# Patient Record
Sex: Male | Born: 1985 | Race: White | Hispanic: No | Marital: Married
Health system: Southern US, Community
[De-identification: ages and names within clinical notes are randomized; demographics above are authoritative.]

## PROBLEM LIST (undated history)

## (undated) HISTORY — PX: TYMPANOPLASTY: SHX33

## (undated) HISTORY — PX: TONSILLECTOMY: SUR1361

---
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
## 2007-04-14 ENCOUNTER — Emergency Department (HOSPITAL_COMMUNITY): Admission: EM | Admit: 2007-04-14 | Discharge: 2007-04-14 | Payer: Self-pay | Admitting: Emergency Medicine

## 2007-12-07 ENCOUNTER — Emergency Department (HOSPITAL_COMMUNITY): Admission: EM | Admit: 2007-12-07 | Discharge: 2007-12-07 | Payer: Self-pay | Admitting: Emergency Medicine

## 2008-10-12 ENCOUNTER — Emergency Department (HOSPITAL_COMMUNITY): Admission: EM | Admit: 2008-10-12 | Discharge: 2008-10-12 | Payer: Self-pay | Admitting: Emergency Medicine

## 2011-08-12 ENCOUNTER — Other Ambulatory Visit: Payer: Self-pay | Admitting: Internal Medicine

## 2011-08-12 DIAGNOSIS — N442 Benign cyst of testis: Secondary | ICD-10-CM

## 2011-08-13 ENCOUNTER — Other Ambulatory Visit: Payer: Self-pay | Admitting: Internal Medicine

## 2011-08-13 ENCOUNTER — Ambulatory Visit
Admission: RE | Admit: 2011-08-13 | Discharge: 2011-08-13 | Disposition: A | Discharge status: Home / Self Care | Payer: 59 | Source: Ambulatory Visit | Attending: Internal Medicine | Admitting: Internal Medicine

## 2011-08-13 DIAGNOSIS — N442 Benign cyst of testis: Secondary | ICD-10-CM

## 2017-04-09 ENCOUNTER — Ambulatory Visit
Admission: RE | Admit: 2017-04-09 | Discharge: 2017-04-09 | Disposition: A | Discharge status: Home / Self Care | Payer: 59 | Source: Ambulatory Visit | Attending: Internal Medicine | Admitting: Internal Medicine

## 2017-04-09 ENCOUNTER — Other Ambulatory Visit: Payer: Self-pay | Admitting: Internal Medicine

## 2017-04-09 ENCOUNTER — Observation Stay (HOSPITAL_COMMUNITY)
Admission: EM | Admit: 2017-04-09 | Discharge: 2017-04-11 | Disposition: A | Discharge status: Home / Self Care | Payer: 59 | Attending: Surgery | Admitting: Surgery

## 2017-04-09 ENCOUNTER — Encounter (HOSPITAL_COMMUNITY): Payer: Self-pay | Admitting: Emergency Medicine

## 2017-04-09 DIAGNOSIS — J302 Other seasonal allergic rhinitis: Secondary | ICD-10-CM

## 2017-04-09 DIAGNOSIS — R109 Unspecified abdominal pain: Secondary | ICD-10-CM | POA: Diagnosis not present

## 2017-04-09 DIAGNOSIS — K3533 Acute appendicitis with perforation and localized peritonitis, with abscess: Secondary | ICD-10-CM | POA: Diagnosis present

## 2017-04-09 DIAGNOSIS — K3580 Unspecified acute appendicitis: Secondary | ICD-10-CM

## 2017-04-09 DIAGNOSIS — K353 Acute appendicitis with localized peritonitis: Principal | ICD-10-CM | POA: Insufficient documentation

## 2017-04-09 LAB — COMPREHENSIVE METABOLIC PANEL
ALK PHOS: 69 U/L (ref 38–126)
ALT: 27 U/L (ref 17–63)
ANION GAP: 6 (ref 5–15)
AST: 20 U/L (ref 15–41)
Albumin: 3.6 g/dL (ref 3.5–5.0)
BUN: 13 mg/dL (ref 6–20)
CHLORIDE: 104 mmol/L (ref 101–111)
CO2: 27 mmol/L (ref 22–32)
Calcium: 9.1 mg/dL (ref 8.9–10.3)
Creatinine, Ser: 1.19 mg/dL (ref 0.61–1.24)
GFR calc Af Amer: >60 mL/min (ref >60)
GFR calc non Af Amer: >60 mL/min (ref >60)
Glucose, Bld: 91 mg/dL (ref 65–99)
Potassium: 4.1 mmol/L (ref 3.5–5.1)
Sodium: 137 mmol/L (ref 135–145)
Total Bilirubin: 1.1 mg/dL (ref 0.3–1.2)
Total Protein: 6.3 g/dL — ABNORMAL LOW (ref 6.5–8.1)

## 2017-04-09 LAB — CBC
HCT: 41.3 % (ref 39.0–52.0)
HEMOGLOBIN: 14.3 g/dL (ref 13.0–17.0)
MCH: 29.3 pg (ref 26.0–34.0)
MCHC: 34.6 g/dL (ref 30.0–36.0)
MCV: 84.6 fL (ref 78.0–100.0)
Platelets: 336 10*3/uL (ref 150–400)
RBC: 4.88 MIL/uL (ref 4.22–5.81)
RDW: 13.1 % (ref 11.5–15.5)
WBC: 11.1 10*3/uL — ABNORMAL HIGH (ref 4.0–10.5)

## 2017-04-09 LAB — LIPASE, BLOOD: Lipase: 24 U/L (ref 11–51)

## 2017-04-09 LAB — SURGICAL PCR SCREEN
MRSA, PCR: NEGATIVE
Staphylococcus aureus: POSITIVE — AB

## 2017-04-09 MED ORDER — ACETAMINOPHEN 325 MG PO TABS: 650.0000 mg | ORAL_TABLET | Freq: Four times a day (QID) | ORAL | Status: AC | PRN

## 2017-04-09 MED ORDER — METRONIDAZOLE IN NACL 5-0.79 MG/ML-% IV SOLN
500.0000 mg | Freq: Three times a day (TID) | INTRAVENOUS | Status: DC
  Administered 2017-04-09 – 2017-04-11 (×5): 500 mg via INTRAVENOUS
  Filled 2017-04-09 (×7): qty 100

## 2017-04-09 MED ORDER — IBUPROFEN 600 MG PO TABS: 600.0000 mg | ORAL_TABLET | Freq: Four times a day (QID) | ORAL | 0 refills | Status: AC | PRN

## 2017-04-09 MED ORDER — ACETAMINOPHEN 325 MG PO TABS: 650.0000 mg | ORAL_TABLET | Freq: Four times a day (QID) | ORAL | Status: DC | PRN

## 2017-04-09 MED ORDER — DIPHENHYDRAMINE HCL 50 MG/ML IJ SOLN: 25.0000 mg | Freq: Four times a day (QID) | INTRAMUSCULAR | Status: DC | PRN

## 2017-04-09 MED ORDER — OXYCODONE HCL 5 MG PO TABS
5.0000 mg | ORAL_TABLET | ORAL | Status: DC | PRN
  Administered 2017-04-10: 10 mg via ORAL
  Filled 2017-04-09 (×2): qty 2

## 2017-04-09 MED ORDER — DIPHENHYDRAMINE HCL 25 MG PO CAPS: 25.0000 mg | ORAL_CAPSULE | Freq: Four times a day (QID) | ORAL | Status: DC | PRN

## 2017-04-09 MED ORDER — DEXTROSE 5 % IV SOLN
2.0000 g | INTRAVENOUS | Status: DC
  Administered 2017-04-09 – 2017-04-10 (×2): 2 g via INTRAVENOUS
  Filled 2017-04-09 (×2): qty 2

## 2017-04-09 MED ORDER — METRONIDAZOLE IN NACL 5-0.79 MG/ML-% IV SOLN
500.0000 mg | Freq: Once | INTRAVENOUS | Status: DC
  Administered 2017-04-09: 500 mg via INTRAVENOUS
  Filled 2017-04-09: qty 100

## 2017-04-09 MED ORDER — ACETAMINOPHEN 650 MG RE SUPP: 650.0000 mg | Freq: Four times a day (QID) | RECTAL | Status: DC | PRN

## 2017-04-09 MED ORDER — ONDANSETRON 4 MG PO TBDP: 4.0000 mg | ORAL_TABLET | Freq: Four times a day (QID) | ORAL | Status: DC | PRN

## 2017-04-09 MED ORDER — KCL IN DEXTROSE-NACL 20-5-0.45 MEQ/L-%-% IV SOLN
INTRAVENOUS | Status: DC
  Administered 2017-04-09: 16:00:00 via INTRAVENOUS
  Filled 2017-04-09 (×2): qty 1000

## 2017-04-09 MED ORDER — ONDANSETRON HCL 4 MG/2ML IJ SOLN
4.0000 mg | Freq: Four times a day (QID) | INTRAMUSCULAR | Status: DC | PRN
Start: 2017-04-09 — End: 2017-04-11
  Administered 2017-04-10: 4 mg via INTRAVENOUS
  Filled 2017-04-09: qty 2

## 2017-04-09 MED ORDER — CEFTRIAXONE SODIUM 2 G IJ SOLR
2.0000 g | Freq: Once | INTRAMUSCULAR | Status: DC
  Filled 2017-04-09: qty 2

## 2017-04-09 MED ORDER — IBUPROFEN 600 MG PO TABS
600.0000 mg | ORAL_TABLET | Freq: Four times a day (QID) | ORAL | Status: DC | PRN
  Administered 2017-04-10 – 2017-04-11 (×3): 600 mg via ORAL
  Filled 2017-04-09 (×3): qty 1

## 2017-04-09 MED ORDER — OXYCODONE HCL 5 MG PO TABS: 5.0000 mg | ORAL_TABLET | Freq: Four times a day (QID) | ORAL | 0 refills | Status: AC | PRN

## 2017-04-09 MED ORDER — OXYCODONE HCL 5 MG PO TABS: 5.0000 mg | ORAL_TABLET | Freq: Four times a day (QID) | ORAL | 0 refills | Status: DC | PRN

## 2017-04-09 MED ORDER — IOPAMIDOL (ISOVUE-300) INJECTION 61%
125.0000 mL | Freq: Once | INTRAVENOUS | Status: AC | PRN
  Administered 2017-04-09: 125 mL via INTRAVENOUS

## 2017-04-09 NOTE — Anesthesia Preprocedure Evaluation (Addendum)
Anesthesia Evaluation  Patient identified by MRN, date of birth, ID band Patient awake    Reviewed: Allergy & Precautions, NPO status , Patient's Chart, lab work & pertinent test results  Airway Mallampati: II  TM Distance: >3 FB Neck ROM: Full    Dental  (+) Dental Advisory Given, Teeth Intact   Pulmonary neg pulmonary ROS,    breath sounds clear to auscultation       Cardiovascular negative cardio ROS   Rhythm:Regular Rate:Normal     Neuro/Psych    GI/Hepatic Neg liver ROS, History noted.    Endo/Other  negative endocrine ROS  Renal/GU negative Renal ROS     Musculoskeletal   Abdominal   Peds  Hematology   Anesthesia Other Findings   Reproductive/Obstetrics                           Anesthesia Physical Anesthesia Plan  ASA: I  Anesthesia Plan: General   Post-op Pain Management:    Induction: Intravenous, Rapid sequence and Cricoid pressure planned  Airway Management Planned: Oral ETT  Additional Equipment:   Intra-op Plan:   Post-operative Plan: Extubation in OR  Informed Consent: I have reviewed the patients History and Physical, chart, labs and discussed the procedure including the risks, benefits and alternatives for the proposed anesthesia with the patient or authorized representative who has indicated his/her understanding and acceptance.   Dental advisory given  Plan Discussed with: Anesthesiologist and CRNA  Anesthesia Plan Comments:        Anesthesia Quick Evaluation

## 2017-04-09 NOTE — ED Provider Notes (Signed)
[facility]-EMERGENCY DEPT Provider Note   CSN: [id number] Arrival date & time: 04/09/17  1153     History   Chief Complaint Chief Complaint  Patient presents with  . Abdominal Pain    dx with appendicitis at pcp    HPI Nathan Shields is a 31 y.o. male.  HPI   31 year old male presents today with complaints of appendicitis.  Patient reports that he was seen by his primary care after 2 days of right lower quadrant abdominal pain.  He notes CT scan was done today which showed acute appendicitis.  Patient reports last week he had what appeared to be a stomach virus as his coworker had the same thing.  Resolved without complication.  2 days ago right lower quadrant pain worse with palpation.  He denies any associated nausea vomiting fever diarrhea or bloody bowel movements.  No history of surgeries in the past.  Otherwise healthy.  Patient reports he ate breakfast around 930 this morning, and has been drinking water since.  Patient reports surgical history including tonsillectomy no complications with anesthesia.  History reviewed. No pertinent past medical history.  There are no active problems to display for this patient.   Past Surgical History:  Procedure Laterality Date  . TONSILLECTOMY    . TYMPANOPLASTY         Home Medications    Prior to Admission medications   Not on File    Family History No family history on file.  Social History Social History  Substance Use Topics  . Smoking status: Not on file  . Smokeless tobacco: Not on file  . Alcohol use No     Allergies   Patient has no known allergies.   Review of Systems Review of Systems  All other systems reviewed and are negative.    Physical Exam Updated Vital Signs BP 121/74 (BP Location: Right Arm)   Pulse 78   Temp 98.3 F (36.8 C) (Oral)   Resp 16   SpO2 100%   Physical Exam  Constitutional: He is oriented to person, place, and time. He appears well-developed and well-nourished.  HENT:    Head: Normocephalic and atraumatic.  Eyes: Conjunctivae are normal. Pupils are equal, round, and reactive to light. Right eye exhibits no discharge. Left eye exhibits no discharge. No scleral icterus.  Neck: Normal range of motion. No JVD present. No tracheal deviation present.  Pulmonary/Chest: Effort normal. No stridor.  Abdominal:  Minor TTP RLQ   Neurological: He is alert and oriented to person, place, and time. Coordination normal.  Psychiatric: He has a normal mood and affect. His behavior is normal. Judgment and thought content normal.  Nursing note and vitals reviewed.   ED Treatments / Results  Labs (all labs ordered are listed, but only abnormal results are displayed) Labs Reviewed  COMPREHENSIVE METABOLIC PANEL - Abnormal; Notable for the following:       Result Value   Total Protein 6.3 (*)    All other components within normal limits  CBC - Abnormal; Notable for the following:    WBC 11.1 (*)    All other components within normal limits  LIPASE, BLOOD  URINALYSIS, ROUTINE W REFLEX MICROSCOPIC    EKG  EKG Interpretation None       Radiology Ct Abdomen Pelvis W Contrast  Result Date: 04/09/2017 CLINICAL DATA:  Right lower quadrant pain for 3 days EXAM: CT ABDOMEN AND PELVIS WITH CONTRAST TECHNIQUE: Multidetector CT imaging of the abdomen and pelvis was performed using  the standard protocol following bolus administration of intravenous contrast. CONTRAST:  ISOVUE-300 IOPAMIDOL (ISOVUE-300) INJECTION 61% COMPARISON:  None. FINDINGS: Lower chest: Lung bases are clear. Hepatobiliary: No focal liver lesions are evident. Gallbladder is mildly contracted without gallbladder wall thickening. No biliary duct dilatation. Pancreas: No pancreatic mass or inflammatory focus. Spleen: No splenic lesions are evident. Adrenals/Urinary Tract: Adrenals appear normal bilaterally. Kidneys bilaterally show no evidence of mass or hydronephrosis on either side. There is no renal or  ureteral calculus on either side. Urinary bladder is midline with wall thickness within normal limits. Stomach/Bowel: There is no bowel wall or mesenteric thickening. No bowel obstruction. No free air or portal venous air. Vascular/Lymphatic: There is no abdominal aortic aneurysm. No vascular lesion evident. Multiple lymph nodes are noted in the periappendiceal region, several of which are borderline prominent, likely due to appendiceal inflammation. No other adenopathy evident in the abdomen or pelvis. Reproductive: Prostate and seminal vesicles appear normal in size and contour. No pelvic mass. Other: There is diffuse thickening of the appendix with surrounding mesenteric inflammation/ stranding consistent with acute appendicitis. There is no well-defined abscess or perforation. There is no abscess or ascites elsewhere in the abdomen or pelvis. Musculoskeletal: There are no blastic or lytic bone lesions. There is no intramuscular or abdominal wall lesion. IMPRESSION: Evidence of acute appendicitis. No free air. Several prominent lymph nodes in the periappendiceal region are likely due to the acute appendicitis. No adenopathy seen elsewhere. No frank abscess in the abdomen or pelvis.  No bowel obstruction. No renal or ureteral calculus.  No hydronephrosis. Critical Value/emergent results were called by telephone at the time of interpretation on 04/09/2017 at 11:37 am to Dr. Windy Fast POLITE , who verbally acknowledged these results. Electronically Signed   By: Bretta Bang III M.D.   On: 04/09/2017 11:38    Procedures Procedures (including critical care time)  Medications Ordered in ED Medications  cefTRIAXone (ROCEPHIN) 2 g in dextrose 5 % 50 mL IVPB (not administered)    And  metroNIDAZOLE (FLAGYL) IVPB 500 mg (500 mg Intravenous New Bag/Given 04/09/17 1402)     Initial Impression / Assessment and Plan / ED Course  I have reviewed the triage vital signs and the nursing notes.  Pertinent labs &  imaging results that were available during my care of the patient were reviewed by me and considered in my medical decision making (see chart for details).     Final Clinical Impressions(s) / ED Diagnoses   Final diagnoses:  Acute appendicitis, unspecified acute appendicitis type   31 year old male presents today with appendicitis.  This is uncomplicated he is very well-appearing in no acute distress.  General surgery consulted who will admit the patient.  Patient given antibiotics here.   New Prescriptions New Prescriptions   No medications on file     Eyvonne Mechanic, PA-C 04/09/17 1427    Derwood Kaplan, MD 04/10/17 1202

## 2017-04-09 NOTE — ED Triage Notes (Signed)
Pt reports RLQ pain x3 days, seen at pcp today and diagnosed with appendicitis, per patient.

## 2017-04-09 NOTE — Discharge Summary (Addendum)
[facility] Discharge Summary   Physician Discharge Summary  Patient ID: Nathan Shields MRN: [id number] DOB/AGE: 08/19/1986  31 y.o.  Admit date: 04/09/2017 Discharge date: 04/11/2017  Patient Care Team: No Pcp Per Patient as PCP - General (General Practice)  Discharge Diagnoses:  Principal Problem:   Acute appendicitis with peritoneal abscess s/p lap appendectomy 04/10/2017 Active Problems:   Seasonal allergies   POST-OPERATIVE DIAGNOSIS:   Acute appendicitis perforated with abscess  SURGERY:  04/09/2017 - 04/10/2017  Procedure(s): APPENDECTOMY LAPAROSCOPIC  SURGEON:    Surgeon(s): Erroll Luna, MD  Consults: None  Hospital Course:   The patient underwent the surgery above.  Postoperatively, the patient gradually mobilized and advanced to a solid diet.  Pain and other symptoms were treated aggressively.    By the time of discharge, the patient was walking well the hallways, eating food, having flatus.  Pain was well-controlled on an oral medications.  Based on meeting discharge criteria and continuing to recover, I felt it was safe for the patient to be discharged from the hospital to further recover with close followup.  Continue drain x 10 days.  Complete antibiotics in pill form x 5 more days.  I updated the patient's status to the patient and spouse.  Recommendations were made.  Questions were answered.  They expressed understanding & appreciation.  Discharged Condition: good  Disposition:  [facility], [place]. Go on 05/04/2017.   Specialty:  General Surgery Why:  at 11:00 AM for post-operative follow up. please arrive 30 minutes early.  Contact information: [address] [place]. Schedule an appointment as soon as possible for a visit in 10 day(s).   Specialty:  General Surgery Why:  To have your drain removed Contact  information: [address]  [place] [address] [phone]           Final discharge disposition not confirmed  Discharge Instructions    Call MD for:    Complete by:  As directed    FEVER > 101.5 F  (temperatures < 101.5 F are not significant)   Call MD for:  extreme fatigue    Complete by:  As directed    Call MD for:  persistant dizziness or light-headedness    Complete by:  As directed    Call MD for:  persistant nausea and vomiting    Complete by:  As directed    Call MD for:  redness, tenderness, or signs of infection (pain, swelling, redness, odor or green/yellow discharge around incision site)    Complete by:  As directed    Call MD for:  severe uncontrolled pain    Complete by:  As directed    Diet - low sodium heart healthy    Complete by:  As directed    Follow a light diet the first few days at home.   Start with a bland diet such as soups, liquids, starchy foods, low fat foods, etc.   If you feel full, bloated, or constipated, stay on a full liquid or pureed/blenderized diet for a few days until you feel better and no longer constipated. Be sure to drink plenty of fluids every day to avoid getting dehydrated (feeling dizzy, not urinating, etc.). Gradually add a fiber supplement to your diet   Discharge instructions    Complete by:  As directed    See Discharge Instructions  If you are not getting better after two weeks or are noticing you are getting worse, contact our office [phone] for further advice.  We may need to adjust your medications, re-evaluate you in the office, send you to the emergency room, or see what other things we can do to help. The clinic staff is available to answer your questions during regular business hours (8:30am-5pm).  Please don't hesitate to call and ask to speak to one of our nurses for clinical concerns.    A surgeon from [facility] is always on call at the hospitals 24 hours/day If you have a  medical emergency, go to the nearest emergency room or call 911.   Driving Restrictions    Complete by:  As directed    You may drive when you are no longer taking narcotic prescription pain medication, you can comfortably wear a seatbelt, and you can safely make sudden turns/stops to protect yourself without hesitating due to pain.   Increase activity slowly    Complete by:  As directed    Start light daily activities --- self-care, walking, climbing stairs- beginning the day after surgery.  Gradually increase activities as tolerated.  Control your pain to be active.  Stop when you are tired.  Ideally, walk several times a day, eventually an hour a day.   Most people are back to most day-to-day activities in a few weeks.  It takes 4-8 weeks to get back to unrestricted, intense activity. If you can walk 30 minutes without difficulty, it is safe to try more intense activity such as jogging, treadmill, bicycling, low-impact aerobics, swimming, etc. Save the most intensive and strenuous activity for last (Usually 4-8 weeks after surgery) such as sit-ups, heavy lifting, contact sports, etc.  Refrain from any intense heavy lifting or straining until you are off narcotics for pain control.  You will have off days, but things should improve week-by-week. DO NOT PUSH THROUGH PAIN.  Let pain be your guide: If it hurts to do something, don't do it.  Pain is your body warning you to avoid that activity for another week until the pain goes down.   Lifting restrictions    Complete by:  As directed    If you can walk 30 minutes without difficulty, it is safe to try more intense activity such as jogging, treadmill, bicycling, low-impact aerobics, swimming, etc. Save the most intensive and strenuous activity for last (Usually 4-8 weeks after surgery) such as sit-ups, heavy lifting, contact sports, etc.  Refrain from any intense heavy lifting or straining until you are off narcotics for pain control.  You will have off  days, but things should improve week-by-week. DO NOT PUSH THROUGH PAIN.  Let pain be your guide: If it hurts to do something, don't do it.  Pain is your body warning you to avoid that activity for another week until the pain goes down.   May walk up steps    Complete by:  As directed    No wound care    Complete by:  As directed    It is good for closed incision and even open wounds to be washed every day.  Shower every day.  Short baths are fine.  Wash the incisions and wounds clean with soap & water.    If you have a closed incision(s), wash the incision with soap & water every day.  You may leave closed incisions open to air if it is dry.   You may  cover the incision with clean gauze & replace it after your daily shower for comfort.  You have a drain, wash around the skin exit site with soap & water and place a new dressing of gauze or band aid around the skin every day.  Keep the drain site clean & dry.   Sexual Activity Restrictions    Complete by:  As directed    You may have sexual intercourse when it is comfortable. If it hurts to do something, stop.      Allergies as of 04/11/2017   No Known Allergies     Medication List    TAKE these medications   acetaminophen 325 MG tablet Commonly known as:  TYLENOL Take 2 tablets (650 mg total) by mouth every 6 (six) hours as needed for mild pain (or temp > 100).   amoxicillin-clavulanate 875-125 MG tablet Commonly known as:  AUGMENTIN Take 1 tablet by mouth 2 (two) times daily.   fexofenadine 180 MG tablet Commonly known as:  ALLEGRA Take 180 mg by mouth daily.   ibuprofen 600 MG tablet Commonly known as:  ADVIL,MOTRIN Take 1 tablet (600 mg total) by mouth every 6 (six) hours as needed for mild pain.   oxyCODONE 5 MG immediate release tablet Commonly known as:  Oxy IR/ROXICODONE Take 1-2 tablets (5-10 mg total) by mouth every 6 (six) hours as needed for moderate pain.       Significant Diagnostic Studies:  Results for  orders placed or performed during the hospital encounter of 04/09/17 (from the past 72 hour(s))  Lipase, blood     Status: None   Collection Time: 04/09/17 12:03 PM  Result Value Ref Range   Lipase 24 11 - 51 U/L  Comprehensive metabolic panel     Status: Abnormal   Collection Time: 04/09/17 12:03 PM  Result Value Ref Range   Sodium 137 135 - 145 mmol/L   Potassium 4.1 3.5 - 5.1 mmol/L   Chloride 104 101 - 111 mmol/L   CO2 27 22 - 32 mmol/L   Glucose, Bld 91 65 - 99 mg/dL   BUN 13 6 - 20 mg/dL   Creatinine, Ser 1.19 0.61 - 1.24 mg/dL   Calcium 9.1 8.9 - 10.3 mg/dL   Total Protein 6.3 (L) 6.5 - 8.1 g/dL   Albumin 3.6 3.5 - 5.0 g/dL   AST 20 15 - 41 U/L   ALT 27 17 - 63 U/L   Alkaline Phosphatase 69 38 - 126 U/L   Total Bilirubin 1.1 0.3 - 1.2 mg/dL   GFR calc non Af Amer >60 >60 mL/min   GFR calc Af Amer >60 >60 mL/min    Comment: (NOTE) The eGFR has been calculated using the CKD EPI equation. This calculation has not been validated in all clinical situations. eGFR's persistently <60 mL/min signify possible Chronic Kidney Disease.    Anion gap 6 5 - 15  CBC     Status: Abnormal   Collection Time: 04/09/17 12:03 PM  Result Value Ref Range   WBC 11.1 (H) 4.0 - 10.5 K/uL   RBC 4.88 4.22 - 5.81 MIL/uL   Hemoglobin 14.3 13.0 - 17.0 g/dL   HCT 41.3 39.0 - 52.0 %   MCV 84.6 78.0 - 100.0 fL   MCH 29.3 26.0 - 34.0 pg   MCHC 34.6 30.0 - 36.0 g/dL   RDW 13.1 11.5 - 15.5 %   Platelets 336 150 - 400 K/uL  HIV antibody (Routine Testing)  Status: None   Collection Time: 04/09/17  2:48 PM  Result Value Ref Range   HIV Screen 4th Generation wRfx Non Reactive Non Reactive    Comment: (NOTE) Performed At: [facility] [place], [place] [id number] Lindon Romp MD LN:[id number]   Surgical pcr screen     Status: Abnormal   Collection Time: 04/09/17  5:12 PM  Result Value Ref Range   MRSA, PCR NEGATIVE NEGATIVE   Staphylococcus aureus POSITIVE (A) NEGATIVE     Comment:        The Xpert SA Assay (FDA approved for NASAL specimens in patients over 86 years of age), is one component of a comprehensive surveillance program.  Test performance has been validated by [facility] for patients greater than or equal to 79 year old. It is not intended to diagnose infection nor to guide or monitor treatment.   CBC     Status: None   Collection Time: 04/10/17  5:46 AM  Result Value Ref Range   WBC 8.4 4.0 - 10.5 K/uL   RBC 4.64 4.22 - 5.81 MIL/uL   Hemoglobin 13.5 13.0 - 17.0 g/dL   HCT 39.2 39.0 - 52.0 %   MCV 84.5 78.0 - 100.0 fL   MCH 29.1 26.0 - 34.0 pg   MCHC 34.4 30.0 - 36.0 g/dL   RDW 12.9 11.5 - 15.5 %   Platelets 287 150 - 400 K/uL    Ct Abdomen Pelvis W Contrast  Result Date: 04/09/2017 CLINICAL DATA:  Right lower quadrant pain for 3 days EXAM: CT ABDOMEN AND PELVIS WITH CONTRAST TECHNIQUE: Multidetector CT imaging of the abdomen and pelvis was performed using the standard protocol following bolus administration of intravenous contrast. CONTRAST:  130m ISOVUE-300 IOPAMIDOL (ISOVUE-300) INJECTION 61% COMPARISON:  None. FINDINGS: Lower chest: Lung bases are clear. Hepatobiliary: No focal liver lesions are evident. Gallbladder is mildly contracted without gallbladder wall thickening. No biliary duct dilatation. Pancreas: No pancreatic mass or inflammatory focus. Spleen: No splenic lesions are evident. Adrenals/Urinary Tract: Adrenals appear normal bilaterally. Kidneys bilaterally show no evidence of mass or hydronephrosis on either side. There is no renal or ureteral calculus on either side. Urinary bladder is midline with wall thickness within normal limits. Stomach/Bowel: There is no bowel wall or mesenteric thickening. No bowel obstruction. No free air or portal venous air. Vascular/Lymphatic: There is no abdominal aortic aneurysm. No vascular lesion evident. Multiple lymph nodes are noted in the periappendiceal region, several of which are  borderline prominent, likely due to appendiceal inflammation. No other adenopathy evident in the abdomen or pelvis. Reproductive: Prostate and seminal vesicles appear normal in size and contour. No pelvic mass. Other: There is diffuse thickening of the appendix with surrounding mesenteric inflammation/ stranding consistent with acute appendicitis. There is no well-defined abscess or perforation. There is no abscess or ascites elsewhere in the abdomen or pelvis. Musculoskeletal: There are no blastic or lytic bone lesions. There is no intramuscular or abdominal wall lesion. IMPRESSION: Evidence of acute appendicitis. No free air. Several prominent lymph nodes in the periappendiceal region are likely due to the acute appendicitis. No adenopathy seen elsewhere. No frank abscess in the abdomen or pelvis.  No bowel obstruction. No renal or ureteral calculus.  No hydronephrosis. Critical Value/emergent results were called by telephone at the time of interpretation on 04/09/2017 at 11:37 am to Dr. RJori MollPOLITE , who verbally acknowledged these results. Electronically Signed   By: WLowella GripIII M.D.   On: 04/09/2017 11:38  Discharge Exam: Blood pressure (!) 110/56, pulse 71, temperature 98.2 F (36.8 C), temperature source Oral, resp. rate 18, height '5\' 8"'$  (1.727 m), weight 90.2 kg (198 lb 12.8 oz), SpO2 98 %.  General: Pt awake/alert/oriented x4 in No acute distress Eyes: PERRL, normal EOM.  Sclera clear.  No icterus Neuro: CN II-XII intact w/o focal sensory/motor deficits. Lymph: No head/neck/groin lymphadenopathy Psych:  No delerium/psychosis/paranoia HENT: Normocephalic, Mucus membranes moist.  No thrush Neck: Supple, No tracheal deviation Chest: No chest wall pain w good excursion CV:  Pulses intact.  Regular rhythm MS: Normal AROM mjr joints.  No obvious deformity Abdomen: Soft.  Mildy distended.  Mildly tender at incisions only.  No evidence of peritonitis.  No incarcerated hernias. Ext:   SCDs BLE.  No mjr edema.  No cyanosis Skin: No petechiae / purpura  History reviewed. No pertinent past medical history.  Past Surgical History:  Procedure Laterality Date  . LAPAROSCOPIC APPENDECTOMY N/A 04/10/2017   Procedure: APPENDECTOMY LAPAROSCOPIC;  Surgeon: Erroll Luna, MD;  Location: [place];  Service: General;  Laterality: N/A;  . TONSILLECTOMY    . TYMPANOPLASTY      Social History   Social History  . Marital status: Married    Spouse name: N/A  . Number of children: N/A  . Years of education: N/A   Occupational History  . Not on file.   Social History Main Topics  . Smoking status: Not on file  . Smokeless tobacco: Not on file  . Alcohol use No  . Drug use: No  . Sexual activity: Not on file   Other Topics Concern  . Not on file   Social History Narrative  . No narrative on file    No family history on file.  Current Facility-Administered Medications  Medication Dose Route Frequency Provider Last Rate Last Dose  . acetaminophen (TYLENOL) tablet 650 mg  650 mg Oral Q6H PRN Jill Alexanders, PA-C       Or  . acetaminophen (TYLENOL) suppository 650 mg  650 mg Rectal Q6H PRN Darci Current Simaan, PA-C      . cefTRIAXone (ROCEPHIN) 2 g in dextrose 5 % 50 mL IVPB  2 g Intravenous Q24H Jill Alexanders, PA-C   Stopped at 04/10/17 1515   And  . metroNIDAZOLE (FLAGYL) IVPB 500 mg  500 mg Intravenous [address], PA-C   Stopped at 04/11/17 0801  . Chlorhexidine Gluconate Cloth 2 % PADS 6 each  6 each Topical Daily Michael Boston, MD      . dextrose 5 % and 0.45 % NaCl with KCl 20 mEq/L infusion   Intravenous Continuous Erroll Luna, MD 75 mL/hr at 04/11/17 0119    . diphenhydrAMINE (BENADRYL) capsule 25 mg  25 mg Oral Q6H PRN Darci Current Simaan, PA-C       Or  . diphenhydrAMINE (BENADRYL) injection 25 mg  25 mg Intravenous Q6H PRN Jill Alexanders, PA-C      . HYDROmorphone (DILAUDID) injection 1 mg  1 mg Intravenous Q2H PRN Erroll Luna, MD       . ibuprofen (ADVIL,MOTRIN) tablet 600 mg  600 mg Oral Q6H PRN Darci Current Simaan, PA-C   600 mg at 04/11/17 0428  . mupirocin ointment (BACTROBAN) 2 % 1 application  1 application Nasal BID Michael Boston, MD      . ondansetron (ZOFRAN-ODT) disintegrating tablet 4 mg  4 mg Oral Q6H PRN Jill Alexanders, PA-C       Or  .  ondansetron (ZOFRAN) injection 4 mg  4 mg Intravenous Q6H PRN Jill Alexanders, PA-C   4 mg at 04/10/17 0845  . oxyCODONE (Oxy IR/ROXICODONE) immediate release tablet 5-10 mg  5-10 mg Oral Q4H PRN Jill Alexanders, PA-C   10 mg at 04/10/17 1455     No Known Allergies  Signed: Morton Peters, M.D., F.A.C.S. Gastrointestinal and Minimally Invasive Surgery [facility] [address] [place], [place] [address] [phone] Main / Paging   04/11/2017, 9:53 AM

## 2017-04-09 NOTE — H&P (Signed)
[facility] Admission Note  Nathan Shields [birth year]  [id number].    Requesting MD: Kathrynn Humble, MD Chief Complaint/Reason for Consult: acute appendicitis  HPI:  Mr. Nathan Shields is a 31 y.o male with no significant PMH who presents to [facility] from his PCP with 2 days of of abdominal pain. Patient reports his pain started on 4/25 and is described as sharp, non-radiating, RLQ pain associated with chills. He feels the pain has become less severe in the past 24 hours, but still reports significant RLQ tenderness to palpation. When he began having pain his PCP scheduled him for a CT scan which revealed early, uncomplicated appendicitis and he was encouraged him to come to the ED for treatment. WBC 11,000. He has NKDA. He is a non-smoker and denies regular EtOH use. Denies use of blood thinning medications. Last meal was at 0930 but he has been drinking water since 1400. 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  Patient is requesting to have off floor privileges during admission to visit his mother and grandmother who are hospitalized at [facility] for CV[facility].  ROS: Review of Systems  Constitutional: Positive for chills. Negative for fever.  Gastrointestinal: Positive for abdominal pain. Negative for blood in stool, constipation, diarrhea, melena, nausea and vomiting.  All other systems reviewed and are negative.   No family history on file.  History reviewed. No pertinent past medical history.  Past Surgical History:  Procedure Laterality Date  . TONSILLECTOMY    . TYMPANOPLASTY      Social History:  reports that he does not drink alcohol or use drugs. His tobacco history is not on file.  Allergies: No Known Allergies   (Not in a hospital admission)  Blood pressure 121/74, pulse 78, temperature 98.3 F (36.8 C), temperature source Oral, resp. rate 16, SpO2 100 %. Physical Exam: Physical Exam  Constitutional: He is oriented to person, place, and time. He appears well-developed and  well-nourished. No distress.  HENT:  Head: Normocephalic and atraumatic.  Eyes: EOM are normal. Pupils are equal, round, and reactive to light.  Neck: Normal range of motion. No tracheal deviation present. No thyromegaly present.  Cardiovascular: Normal rate, regular rhythm, normal heart sounds and intact distal pulses.  Exam reveals no friction rub.   No murmur heard. Pulmonary/Chest: Effort normal and breath sounds normal. No respiratory distress. He has no wheezes. He has no rales.  Abdominal: Soft. Bowel sounds are normal. He exhibits distension. He exhibits no mass. There is tenderness (focal, RLQ). There is no rebound and no guarding. No hernia.  Musculoskeletal: Normal range of motion. He exhibits no edema or deformity.  Neurological: He is alert and oriented to person, place, and time. No sensory deficit.  Skin: Skin is warm and dry. No rash noted. He is not diaphoretic. No erythema.  Psychiatric: He has a normal mood and affect. His behavior is normal.    Results for orders placed or performed during the hospital encounter of 04/09/17 (from the past 48 hour(s))  Lipase, blood     Status: None   Collection Time: 04/09/17 12:03 PM  Result Value Ref Range   Lipase 24 11 - 51 U/L  Comprehensive metabolic panel     Status: Abnormal   Collection Time: 04/09/17 12:03 PM  Result Value Ref Range   Sodium 137 135 - 145 mmol/L   Potassium 4.1 3.5 - 5.1 mmol/L   Chloride 104 101 - 111 mmol/L   CO2 27 22 - 32 mmol/L  Glucose, Bld 91 65 - 99 mg/dL   BUN 13 6 - 20 mg/dL   Creatinine, Ser 1.19 0.61 - 1.24 mg/dL   Calcium 9.1 8.9 - 10.3 mg/dL   Total Protein 6.3 (L) 6.5 - 8.1 g/dL   Albumin 3.6 3.5 - 5.0 g/dL   AST 20 15 - 41 U/L   ALT 27 17 - 63 U/L   Alkaline Phosphatase 69 38 - 126 U/L   Total Bilirubin 1.1 0.3 - 1.2 mg/dL   GFR calc non Af Amer >60 >60 mL/min   GFR calc Af Amer >60 >60 mL/min    Comment: (NOTE) The eGFR has been calculated using the CKD EPI equation. This  calculation has not been validated in all clinical situations. eGFR's persistently <60 mL/min signify possible Chronic Kidney Disease.    Anion gap 6 5 - 15  CBC     Status: Abnormal   Collection Time: 04/09/17 12:03 PM  Result Value Ref Range   WBC 11.1 (H) 4.0 - 10.5 K/uL   RBC 4.88 4.22 - 5.81 MIL/uL   Hemoglobin 14.3 13.0 - 17.0 g/dL   HCT 41.3 39.0 - 52.0 %   MCV 84.6 78.0 - 100.0 fL   MCH 29.3 26.0 - 34.0 pg   MCHC 34.6 30.0 - 36.0 g/dL   RDW 13.1 11.5 - 15.5 %   Platelets 336 150 - 400 K/uL   Ct Abdomen Pelvis W Contrast  Result Date: 04/09/2017 CLINICAL DATA:  Right lower quadrant pain for 3 days EXAM: CT ABDOMEN AND PELVIS WITH CONTRAST TECHNIQUE: Multidetector CT imaging of the abdomen and pelvis was performed using the standard protocol following bolus administration of intravenous contrast. CONTRAST:  165m ISOVUE-300 IOPAMIDOL (ISOVUE-300) INJECTION 61% COMPARISON:  None. FINDINGS: Lower chest: Lung bases are clear. Hepatobiliary: No focal liver lesions are evident. Gallbladder is mildly contracted without gallbladder wall thickening. No biliary duct dilatation. Pancreas: No pancreatic mass or inflammatory focus. Spleen: No splenic lesions are evident. Adrenals/Urinary Tract: Adrenals appear normal bilaterally. Kidneys bilaterally show no evidence of mass or hydronephrosis on either side. There is no renal or ureteral calculus on either side. Urinary bladder is midline with wall thickness within normal limits. Stomach/Bowel: There is no bowel wall or mesenteric thickening. No bowel obstruction. No free air or portal venous air. Vascular/Lymphatic: There is no abdominal aortic aneurysm. No vascular lesion evident. Multiple lymph nodes are noted in the periappendiceal region, several of which are borderline prominent, likely due to appendiceal inflammation. No other adenopathy evident in the abdomen or pelvis. Reproductive: Prostate and seminal vesicles appear normal in size and  contour. No pelvic mass. Other: There is diffuse thickening of the appendix with surrounding mesenteric inflammation/ stranding consistent with acute appendicitis. There is no well-defined abscess or perforation. There is no abscess or ascites elsewhere in the abdomen or pelvis. Musculoskeletal: There are no blastic or lytic bone lesions. There is no intramuscular or abdominal wall lesion. IMPRESSION: Evidence of acute appendicitis. No free air. Several prominent lymph nodes in the periappendiceal region are likely due to the acute appendicitis. No adenopathy seen elsewhere. No frank abscess in the abdomen or pelvis.  No bowel obstruction. No renal or ureteral calculus.  No hydronephrosis. Critical Value/emergent results were called by telephone at the time of interpretation on 04/09/2017 at 11:37 am to Dr. RJori MollPOLITE , who verbally acknowledged these results. Electronically Signed   By: WLowella GripIII M.D.   On: 04/09/2017 11:38   Assessment/Plan Acute appendicitis  -  admit to [facility] service - NPO, IVF  - IV abx - laparoscopic appendectomy this evening vs tomorrow morning, as patient has been drinking liquids this afternoon.   Leukocytosis- 2/2 above ID- IV Rocephin/Flagyl  VTE- [place], [facility] 04/09/2017, 2:12 PM Pager: [phone] Consults: [phone] Mon-Fri 7:00 am-4:30 pm Sat-Sun 7:00 am-11:30 am

## 2017-04-09 NOTE — ED Notes (Signed)
Surgery at bedside.

## 2017-04-10 ENCOUNTER — Encounter (HOSPITAL_COMMUNITY)
Admission: EM | Disposition: A | Discharge status: Home / Self Care | Payer: Self-pay | Source: Home / Self Care | Attending: Emergency Medicine

## 2017-04-10 ENCOUNTER — Observation Stay (HOSPITAL_COMMUNITY): Payer: 59 | Admitting: Anesthesiology

## 2017-04-10 DIAGNOSIS — K3580 Unspecified acute appendicitis: Secondary | ICD-10-CM | POA: Diagnosis not present

## 2017-04-10 DIAGNOSIS — K37 Unspecified appendicitis: Secondary | ICD-10-CM | POA: Diagnosis not present

## 2017-04-10 DIAGNOSIS — R1031 Right lower quadrant pain: Secondary | ICD-10-CM | POA: Diagnosis not present

## 2017-04-10 DIAGNOSIS — K353 Acute appendicitis with localized peritonitis: Secondary | ICD-10-CM | POA: Diagnosis not present

## 2017-04-10 HISTORY — PX: LAPAROSCOPIC APPENDECTOMY: SHX408

## 2017-04-10 LAB — CBC
HCT: 39.2 % (ref 39.0–52.0)
Hemoglobin: 13.5 g/dL (ref 13.0–17.0)
MCH: 29.1 pg (ref 26.0–34.0)
MCHC: 34.4 g/dL (ref 30.0–36.0)
MCV: 84.5 fL (ref 78.0–100.0)
PLATELETS: 287 10*3/uL (ref 150–400)
RBC: 4.64 MIL/uL (ref 4.22–5.81)
RDW: 12.9 % (ref 11.5–15.5)
WBC: 8.4 10*3/uL (ref 4.0–10.5)

## 2017-04-10 LAB — HIV ANTIBODY (ROUTINE TESTING W REFLEX): HIV SCREEN 4TH GENERATION: NONREACTIVE

## 2017-04-10 SURGERY — APPENDECTOMY, LAPAROSCOPIC
Anesthesia: General | Site: Abdomen

## 2017-04-10 MED ORDER — EPHEDRINE 5 MG/ML INJ
INTRAVENOUS | Status: AC
  Filled 2017-04-10: qty 10

## 2017-04-10 MED ORDER — ROCURONIUM BROMIDE 10 MG/ML (PF) SYRINGE
PREFILLED_SYRINGE | INTRAVENOUS | Status: AC
  Filled 2017-04-10: qty 5

## 2017-04-10 MED ORDER — PROPOFOL 10 MG/ML IV BOLUS
INTRAVENOUS | Status: DC | PRN
  Administered 2017-04-10: 50 mg via INTRAVENOUS
  Administered 2017-04-10: 150 mg via INTRAVENOUS

## 2017-04-10 MED ORDER — LIDOCAINE 2% (20 MG/ML) 5 ML SYRINGE
INTRAMUSCULAR | Status: AC
  Filled 2017-04-10: qty 5

## 2017-04-10 MED ORDER — BUPIVACAINE-EPINEPHRINE 0.5% -1:200000 IJ SOLN
INTRAMUSCULAR | Status: DC | PRN
  Administered 2017-04-10: 8 mL

## 2017-04-10 MED ORDER — SUGAMMADEX SODIUM 200 MG/2ML IV SOLN
INTRAVENOUS | Status: DC | PRN
  Administered 2017-04-10: 100 mg via INTRAVENOUS

## 2017-04-10 MED ORDER — FENTANYL CITRATE (PF) 100 MCG/2ML IJ SOLN
25.0000 ug | INTRAMUSCULAR | Status: DC | PRN
  Administered 2017-04-10 (×2): 50 ug via INTRAVENOUS

## 2017-04-10 MED ORDER — ROCURONIUM BROMIDE 10 MG/ML (PF) SYRINGE
PREFILLED_SYRINGE | INTRAVENOUS | Status: DC | PRN
  Administered 2017-04-10: 10 mg via INTRAVENOUS
  Administered 2017-04-10: 40 mg via INTRAVENOUS

## 2017-04-10 MED ORDER — MIDAZOLAM HCL 2 MG/2ML IJ SOLN
INTRAMUSCULAR | Status: DC | PRN
  Administered 2017-04-10: 2 mg via INTRAVENOUS

## 2017-04-10 MED ORDER — SODIUM CHLORIDE 0.9 % IR SOLN
Status: DC | PRN
  Administered 2017-04-10 (×2): 1000 mL

## 2017-04-10 MED ORDER — HYDROMORPHONE HCL 1 MG/ML IJ SOLN
INTRAMUSCULAR | Status: AC
  Filled 2017-04-10: qty 1

## 2017-04-10 MED ORDER — FENTANYL CITRATE (PF) 250 MCG/5ML IJ SOLN
INTRAMUSCULAR | Status: DC | PRN
  Administered 2017-04-10: 100 ug via INTRAVENOUS
  Administered 2017-04-10 (×2): 50 ug via INTRAVENOUS

## 2017-04-10 MED ORDER — 0.9 % SODIUM CHLORIDE (POUR BTL) OPTIME
TOPICAL | Status: DC | PRN
  Administered 2017-04-10: 1000 mL

## 2017-04-10 MED ORDER — BUPIVACAINE-EPINEPHRINE (PF) 0.5% -1:200000 IJ SOLN
INTRAMUSCULAR | Status: AC
  Filled 2017-04-10: qty 30

## 2017-04-10 MED ORDER — LACTATED RINGERS IV SOLN
INTRAVENOUS | Status: DC
Start: 2017-04-10 — End: 2017-04-10
  Administered 2017-04-10: 07:00:00 via INTRAVENOUS

## 2017-04-10 MED ORDER — PHENYLEPHRINE 40 MCG/ML (10ML) SYRINGE FOR IV PUSH (FOR BLOOD PRESSURE SUPPORT)
PREFILLED_SYRINGE | INTRAVENOUS | Status: AC
  Filled 2017-04-10: qty 10

## 2017-04-10 MED ORDER — MIDAZOLAM HCL 2 MG/2ML IJ SOLN
INTRAMUSCULAR | Status: AC
  Filled 2017-04-10: qty 2

## 2017-04-10 MED ORDER — LIDOCAINE 2% (20 MG/ML) 5 ML SYRINGE
INTRAMUSCULAR | Status: DC | PRN
  Administered 2017-04-10: 100 mg via INTRAVENOUS

## 2017-04-10 MED ORDER — SUCCINYLCHOLINE CHLORIDE 200 MG/10ML IV SOSY
PREFILLED_SYRINGE | INTRAVENOUS | Status: AC
  Filled 2017-04-10: qty 10

## 2017-04-10 MED ORDER — KCL IN DEXTROSE-NACL 20-5-0.45 MEQ/L-%-% IV SOLN
INTRAVENOUS | Status: DC
  Administered 2017-04-10 – 2017-04-11 (×2): via INTRAVENOUS
  Filled 2017-04-10 (×2): qty 1000

## 2017-04-10 MED ORDER — ONDANSETRON HCL 4 MG/2ML IJ SOLN
INTRAMUSCULAR | Status: AC
  Filled 2017-04-10: qty 2

## 2017-04-10 MED ORDER — HYDROMORPHONE HCL 1 MG/ML IJ SOLN: 1.0000 mg | INTRAMUSCULAR | Status: DC | PRN

## 2017-04-10 MED ORDER — FENTANYL CITRATE (PF) 100 MCG/2ML IJ SOLN
INTRAMUSCULAR | Status: AC
  Administered 2017-04-10: 50 ug via INTRAVENOUS
  Filled 2017-04-10: qty 2

## 2017-04-10 MED ORDER — PROPOFOL 10 MG/ML IV BOLUS
INTRAVENOUS | Status: AC
  Filled 2017-04-10: qty 20

## 2017-04-10 MED ORDER — FENTANYL CITRATE (PF) 250 MCG/5ML IJ SOLN
INTRAMUSCULAR | Status: AC
  Filled 2017-04-10: qty 5

## 2017-04-10 SURGICAL SUPPLY — 46 items
ADH SKN CLS APL DERMABOND .7 (GAUZE/BANDAGES/DRESSINGS) ×1
APPLIER CLIP ROT 10 11.4 M/L (STAPLE)
APR CLP MED LRG 11.4X10 (STAPLE)
BAG SPEC RTRVL LRG 6X4 10 (ENDOMECHANICALS) ×1
BLADE CLIPPER SURG (BLADE) ×2 IMPLANT
CANISTER SUCT 3000ML PPV (MISCELLANEOUS) ×3 IMPLANT
CHLORAPREP W/TINT 26ML (MISCELLANEOUS) ×3 IMPLANT
CLIP APPLIE ROT 10 11.4 M/L (STAPLE) IMPLANT
COVER SURGICAL LIGHT HANDLE (MISCELLANEOUS) ×3 IMPLANT
CUTTER FLEX LINEAR 45M (STAPLE) ×3 IMPLANT
DERMABOND ADVANCED (GAUZE/BANDAGES/DRESSINGS) ×2
DERMABOND ADVANCED .7 DNX12 (GAUZE/BANDAGES/DRESSINGS) ×1 IMPLANT
DRAIN CHANNEL 19F RND (DRAIN) ×2 IMPLANT
DRAPE WARM FLUID 44X44 (DRAPE) ×1 IMPLANT
ELECT REM PT RETURN 9FT ADLT (ELECTROSURGICAL) ×3
ELECTRODE REM PT RTRN 9FT ADLT (ELECTROSURGICAL) ×1 IMPLANT
ENDOLOOP SUT PDS II  0 18 (SUTURE)
ENDOLOOP SUT PDS II 0 18 (SUTURE) IMPLANT
EVACUATOR SILICONE 100CC (DRAIN) ×2 IMPLANT
GLOVE BIO SURGEON STRL SZ8 (GLOVE) ×3 IMPLANT
GLOVE BIOGEL PI IND STRL 8 (GLOVE) ×1 IMPLANT
GLOVE BIOGEL PI INDICATOR 8 (GLOVE) ×2
GOWN STRL REUS W/ TWL LRG LVL3 (GOWN DISPOSABLE) ×2 IMPLANT
GOWN STRL REUS W/ TWL XL LVL3 (GOWN DISPOSABLE) ×1 IMPLANT
GOWN STRL REUS W/TWL LRG LVL3 (GOWN DISPOSABLE) ×3
GOWN STRL REUS W/TWL XL LVL3 (GOWN DISPOSABLE) ×3
KIT BASIN OR (CUSTOM PROCEDURE TRAY) ×3 IMPLANT
KIT ROOM TURNOVER OR (KITS) ×3 IMPLANT
NS IRRIG 1000ML POUR BTL (IV SOLUTION) ×3 IMPLANT
PAD ARMBOARD 7.5X6 YLW CONV (MISCELLANEOUS) ×6 IMPLANT
POUCH SPECIMEN RETRIEVAL 10MM (ENDOMECHANICALS) ×3 IMPLANT
RELOAD STAPLE 45 3.5 BLU ETS (ENDOMECHANICALS) ×1 IMPLANT
RELOAD STAPLE TA45 3.5 REG BLU (ENDOMECHANICALS) ×3 IMPLANT
SCISSORS LAP 5X35 DISP (ENDOMECHANICALS) ×3 IMPLANT
SET IRRIG TUBING LAPAROSCOPIC (IRRIGATION / IRRIGATOR) ×3 IMPLANT
SHEARS HARMONIC ACE PLUS 36CM (ENDOMECHANICALS) ×3 IMPLANT
SPECIMEN JAR SMALL (MISCELLANEOUS) ×3 IMPLANT
SUT ETHILON 2 0 FS 18 (SUTURE) ×2 IMPLANT
SUT MON AB 4-0 PC3 18 (SUTURE) ×3 IMPLANT
TOWEL OR 17X24 6PK STRL BLUE (TOWEL DISPOSABLE) ×1 IMPLANT
TOWEL OR 17X26 10 PK STRL BLUE (TOWEL DISPOSABLE) ×3 IMPLANT
TRAY FOLEY CATH SILVER 16FR (SET/KITS/TRAYS/PACK) ×3 IMPLANT
TRAY LAPAROSCOPIC MC (CUSTOM PROCEDURE TRAY) ×3 IMPLANT
TROCAR XCEL BLADELESS 5X75MML (TROCAR) ×6 IMPLANT
TROCAR XCEL BLUNT TIP 100MML (ENDOMECHANICALS) ×3 IMPLANT
TUBING INSUFFLATION (TUBING) ×3 IMPLANT

## 2017-04-10 NOTE — Interval H&P Note (Signed)
History and Physical Interval Note:  04/10/2017 7:35 AM  Nathan Shields  has presented today for surgery, with the diagnosis of ACUTE APPENDICITIS  The various methods of treatment have been discussed with the patient and family. After consideration of risks, benefits and other options for treatment, the patient has consented to  Procedure(s): APPENDECTOMY LAPAROSCOPIC (N/A) as a surgical intervention .  The patient's history has been reviewed, patient examined, no change in status, stable for surgery.  I have reviewed the patient's chart and labs.  Questions were answered to the patient's satisfaction.     [name] A.

## 2017-04-10 NOTE — Anesthesia Procedure Notes (Addendum)
Procedure Name: Intubation Date/Time: 04/10/2017 7:53 AM Performed by: Julieta Bellini Pre-anesthesia Checklist: Patient identified, Emergency Drugs available, Suction available and Patient being monitored Patient Re-evaluated:Patient Re-evaluated prior to inductionOxygen Delivery Method: Circle system utilized Preoxygenation: Pre-oxygenation with 100% oxygen Intubation Type: IV induction Ventilation: Mask ventilation without difficulty Laryngoscope Size: Miller and 2 Grade View: Grade I Tube type: Oral Tube size: 7.5 mm Number of attempts: 2 Airway Equipment and Method: Stylet Placement Confirmation: ETT inserted through vocal cords under direct vision,  positive ETCO2 and breath sounds checked- equal and bilateral Secured at: 23 cm Tube secured with: Tape Dental Injury: Teeth and Oropharynx as per pre-operative assessment  Comments: CRNA attempted x1 with a Mac 3, grade 3 view unable to pass tube. Dr. Nyoka Cowden attempted x1 with a Sabra Heck 2, grade 1 view, successful intubation

## 2017-04-10 NOTE — Progress Notes (Signed)
Pt. Had 1 occurrence of emesis and refused nausea medication. Pt. States "he feels better after throwing up." Will continue to monitor.

## 2017-04-10 NOTE — Transfer of Care (Signed)
Immediate Anesthesia Transfer of Care Note  Patient: Nathan Shields  Procedure(s) Performed: Procedure(s): APPENDECTOMY LAPAROSCOPIC (N/A)  Patient Location: PACU  Anesthesia Type:General  Level of Consciousness: awake, alert , oriented and patient cooperative  Airway & Oxygen Therapy: Patient Spontanous Breathing  Post-op Assessment: Report given to RN, Post -op Vital signs reviewed and stable and Patient moving all extremities X 4  Post vital signs: Reviewed and stable  Last Vitals:  Vitals:   04/10/17 0211 04/10/17 0523  BP: 116/65 (!) 111/59  Pulse: 71 72  Resp: 19 19  Temp: 36.4 C 36.9 C    Last Pain:  Vitals:   04/10/17 0523  TempSrc: Oral  PainSc:          Complications: No apparent anesthesia complications

## 2017-04-10 NOTE — Anesthesia Postprocedure Evaluation (Signed)
Anesthesia Post Note  Patient: Nathan Shields  Procedure(s) Performed: Procedure(s) (LRB): APPENDECTOMY LAPAROSCOPIC (N/A)  Patient location during evaluation: PACU Anesthesia Type: General Level of consciousness: awake Pain management: pain level controlled Vital Signs Assessment: post-procedure vital signs reviewed and stable Respiratory status: spontaneous breathing Cardiovascular status: stable Anesthetic complications: no       Last Vitals:  Vitals:   04/10/17 1027 04/10/17 1107  BP: (!) 145/81 137/78  Pulse: 75 77  Resp: 18 18  Temp:  36.8 C    Last Pain:  Vitals:   04/10/17 1107  TempSrc: Oral  PainSc:                  Marshal Eskew

## 2017-04-10 NOTE — Op Note (Signed)
Appendectomy, Lap, Procedure Note  Indications: The patient presented with a history of right-sided abdominal pain. A CT revealed findings consistent with acute appendicitis. The patient had pain for 2 days prior to presentation. He was tender on examination but not toxically ill. CT scan showed uncomplicated acute appendicitis. The patient had been drinking while in the emergency room therefore surgery was postponed until today. Risks, benefits and all other treatments discussed. I discussed the possibility of perforation given his time course and the need for open appendectomy as well as drain placement. He understood and wished to proceed. The procedure has been discussed with the patient.  Alternative therapies have been discussed with the patient.  Operative risks include bleeding,  Infection,  Organ injury,  Nerve injury,  Blood vessel injury,  DVT,  Pulmonary embolism,  Death,  And possible reoperation.  Medical management risks include worsening of present situation.  The success of the procedure is 50 -95 % at treating patients symptoms.  The patient understands and agrees to proceed. Pre-operative Diagnosis: Acute appendicitis without mention of peritonitis  Post-operative Diagnosis: Acute appendicitis with peritoneal abscess  Surgeon: Harriette Bouillon A.   Assistants: OR staff  Anesthesia: General endotracheal anesthesia and Local anesthesia 0.5% bupivacaine  ASA Class: 1  Procedure Details  The patient was seen again in the Holding Room. The risks, benefits, complications, treatment options, and expected outcomes were discussed with the patient and/or family. The possibilities of reaction to medication, pulmonary aspiration, perforation of viscus, bleeding, recurrent infection, finding a normal appendix, the need for additional procedures, failure to diagnose a condition, and creating a complication requiring transfusion or operation were discussed. There was concurrence with the proposed  plan and informed consent was obtained. The site of surgery was properly noted/marked. The patient was taken to Operating Room, identified as Nathan Shields and the procedure verified as Appendectomy. A Time Out was held and the above information confirmed.  The patient was placed in the supine position and general anesthesia was induced, along with placement of orogastric tube, Venodyne boots, and a Foley catheter. The abdomen was prepped and draped in a sterile fashion. A one centimeter infraumbilical incision was made and the peritoneal cavity was accessed using the OPEN  technique. The pneumoperitoneum was then established to steady pressure of 12 mmHg. A 12 mm port was placed through the umbilical incision. Additional 5 mm cannulas then were placed in the upper and lower midline under direct vision. A careful evaluation of the entire abdomen was carried out. The patient was placed in Trendelenburg and left lateral decubitus position. The small intestines were retracted in the cephalad and left lateral direction away from the pelvis and right lower quadrant. The patient was found to have an enlarged and inflamed appendix that was extending into the pelvis. There was no evidence of perforation.  The appendix was carefully dissected. The appendix was retrocecal and had shown signs of perforation. The dissection was very tedious. The cecum was mobilized over to facilitate dissection. A window was made in the mesoappendix at the base of the appendix. A harmonic scalpel was used across the mesoappendix. The appendix was divided at its base using an endo-GIA stapler. Minimal appendiceal stump was left in place. The appendix was then dissected away in a piecemeal fashion revealing an abscess. The appendiceal fragments were all removed in their entirety. The abscess cavity was irrigated. Hemostasis achieved. Through the lower port site a 19 round drain was placed. There was no evidence of bleeding, leakage, or  complication after division of the appendix. Irrigation was also performed and irrigate suctioned from the abdomen as well.  The appendix was removed in a piecemeal fashion and passed off the field. Inspection revealed no signs of bleeding and irrigation was suctioned out. There is no sign of bowel injury or other injury to the internal viscera after laparoscopy was performed. The ports are removed.   The umbilical port site was closed using 0 vicryl pursestring sutures fashion at the level of the fascia. The trocar site skin wounds were closed using skin staples.  Instrument, sponge, and needle counts were correct at the conclusion of the case.   Findings: The appendix was found to be inflamed. There were signs of necrosis.  There was perforation. There was abscess formation.  Estimated Blood Loss:  less than 50 mL         Drains: 19 F         Total IV Fluids: per record          Specimens: appendix         Complications:  None; patient tolerated the procedure well.         Disposition: PACU - hemodynamically stable.         Condition: stable

## 2017-04-10 NOTE — Progress Notes (Signed)
Pt. Ambulated in the hallway.

## 2017-04-11 ENCOUNTER — Encounter (HOSPITAL_COMMUNITY): Payer: Self-pay | Admitting: Surgery

## 2017-04-11 DIAGNOSIS — J302 Other seasonal allergic rhinitis: Secondary | ICD-10-CM

## 2017-04-11 MED ORDER — CHLORHEXIDINE GLUCONATE CLOTH 2 % EX PADS: 6.0000 | MEDICATED_PAD | Freq: Every day | CUTANEOUS | Status: DC

## 2017-04-11 MED ORDER — MUPIROCIN 2 % EX OINT
1.0000 "application " | TOPICAL_OINTMENT | Freq: Two times a day (BID) | CUTANEOUS | Status: DC
  Filled 2017-04-11: qty 22

## 2017-04-11 MED ORDER — AMOXICILLIN-POT CLAVULANATE 875-125 MG PO TABS: 1.0000 | ORAL_TABLET | Freq: Two times a day (BID) | ORAL | 1 refills | Status: AC

## 2017-04-11 NOTE — Discharge Instructions (Signed)
please arrive at least 30 min before your appointment to complete your check in paperwork.  If you are unable to arrive 30 min prior to your appointment time we may have to cancel or reschedule you.  DRAIN CARE:   You have a closed bulb drain to help you heal.    A bulb drain is a small, plastic reservoir which creates a gentle suction. It is used to remove excess fluid from a surgical wound. The color and amount of fluid will vary. Immediately after surgery, the fluid is bright red. It may gradually change to a yellow color. When the amount decreases to about 1 or 2 tablespoons (15 to 30 cc) per 24 hours, your caregiver will usually remove it.  JP Care  The Jackson-Pratt drainage system has flexible tubing attached to a soft, plastic bulb with a stopper. The drainage end of the tubing, which is flat and white, goes into your body through a small opening near your incision (surgical cut). A stitch holds the drainage end in place. The rest of the tube is outside your body, attached to the bulb. When the bulb is compressed with the stopper in place, it creates a vacuum. This causes a constant gentle suction, which helps draw out fluid that collects under your incision. The bulb should be compressed at all times, except when you are emptying the drainage.  How long you will have your Jackson-Pratt depends on your surgery and the amount of fluid is draining. This is different for everyone. The Jackson-Pratt is usually removed when the drainage is 30 mL or less over 24 hours. To keep track of how much drainage youre having, you will record the amount in a drainage log. Its important to bring the log with you to your follow-up appointments.  Caring for Your Jackson-Pratt at Home In order to care for your Jackson-Pratt at home, you or your caregiver will do the following:  Empty the drain once a day and record the color and amount of drainage  Care for the area where the tubing enters your skin by  washing with soap and water.  Milk the tubing to help move clots into the bulb.  Do this before you empty and measure your drainage. Look in the mirror at the tubing. This will help you see where your hands need to be. Pinch the tubing close to where it goes into your skin between your thumb and forefinger. With the thumb and forefinger of your other hand, pinch the tubing right below your other fingers. Keep your fingers pinched and slide them down the tubing, pushing any clots down toward the bulb. You may want to use alcohol swabs to help you slide your fingers down the tubing. Repeat steps 3 and 4 as necessary to push clots from the tubing into the bulb. If you are not able to move a clot into the bulb, call your doctors office. The fluid may leak around the insertion site if a clot is blocking the drainage flow. If there is fluid in the bulb and no leakage at the insertion site, the drain is working.  How to Empty Your Jackson-Pratt and Record the Drainage You will need to empty your Jackson-Pratt every day  Gather the following supplies:  Measuring container your nurse gave you Jackson-Pratt Drainage Record  Pen or pencil  Instructions Clean an area to work on. Clean your hands thoroughly. Unplug the stopper on top of your Jackson-Pratt. This will cause the bulb to expand. Do  not touch the inside of the stopper or the inner area of the opening on the bulb. Turn your Jackson-Pratt upside down, gently squeeze the bulb, and pour the drainage into the measuring container. Turn your Jackson-Pratt right side up. Squeeze the bulb until your fingers feel the palm of your hand. Keep squeezing the bulb while you replug the stopper. Make sure the bulb stays fully compressed to ensure constant, gentle suction.    Check the amount and color of drainage in the measuring container. The first couple days after surgery the fluid may be dark red. This is normal. As you heal the fluid may look pink  or pale yellow. Record this amount and the color of drainage on your Jackson-Pratt Drainage Record. Flush the drainage down the toilet and rinse the measuring container with water.  Caring for the Insertion Site  Once you have emptied the drainage, clean your hands again. Check the area around the insertion site. Look for tenderness, swelling, or pus. If you have any of these, or if you have a temperature of 101 F (38.3 C) or higher, you may have an infection. Call your doctors office.  Sometimes, the drain causes redness the size of a dime at your insertion site. This is normal. Your healthcare provider will tell you if you should place a bandage over the insertion site.  Wash drain site with soap & water (dilute hydrogen peroxide PRN) daily & replace clean dressing / tape    DAILY CARE  Keep the bulb compressed at all times, except while emptying it. The compression creates suction.   Keep sites where the tubes enter the skin dry and covered with a light bandage (dressing).   Tape the tubes to your skin, 1 to 2 inches below the insertion sites, to keep from pulling on your stitches. Tubes are stitched in place and will not slip out.   Pin the bulb to your shirt (not to your pants) with a safety pin.   For the first few days after surgery, there usually is more fluid in the bulb. Empty the bulb whenever it becomes half full because the bulb does not create enough suction if it is too full. Include this amount in your 24 hour totals.   When the amount of drainage decreases, empty the bulb at the same time every day. Write down the amounts and the 24 hour totals. Your caregiver will want to know them. This helps your caregiver know when the tubes can be removed.   (We anticipate removing the drain in 1-3 weeks, depending on when the output is <1mL a day for 2+ days)  If there is drainage around the tube sites, change dressings and keep the area dry. If you see a clot in the tube,  leave it alone. However, if the tube does not appear to be draining, let your caregiver know.  TO EMPTY THE BULB  Open the stopper to release suction.   Holding the stopper out of the way, pour drainage into the measuring cup that was sent home with you.   Measure and write down the amount. If there are 2 bulbs, note the amount of drainage from bulb 1 or bulb 2 and keep the totals separate. Your caregiver will want to know which tube is draining more.   Compress the bulb by folding it in half.   Replace the stopper.   Check the tape that holds the tube to your skin, and pin the bulb to your shirt.  SEEK MEDICAL CARE IF:  The drainage develops a bad odor.   You have an oral temperature above 102 F (38.9 C).   The amount of drainage from your wound suddenly increases or decreases.   You accidentally pull out your drain.   You have any other questions or concerns.  MAKE SURE YOU:   Understand these instructions.   Will watch your condition.   Will get help right away if you are not doing well or get worse.     Call our office if you have any questions about your drain. [phone]   LAPAROSCOPIC SURGERY: POST OP INSTRUCTIONS  1. DIET: Follow a light bland diet the first 24 hours after arrival home, such as soup, liquids, crackers, etc. Be sure to include lots of fluids daily. Avoid fast food or heavy meals as your are more likely to get nauseated. Eat a low fat the next few days after surgery.  2. Take your usually prescribed home medications unless otherwise directed. 3. PAIN CONTROL:  1. Pain is best controlled by a usual combination of three different methods TOGETHER:  1. Ice/Heat 2. Over the counter pain medication 3. Prescription pain medication 2. Most patients will experience some swelling and bruising around the incisions. Ice packs or heating pads (30-60 minutes up to 6 times a day) will help. Use ice for the first few days to help decrease swelling and  bruising, then switch to heat to help relax tight/sore spots and speed recovery. Some people prefer to use ice alone, heat alone, alternating between ice & heat. Experiment to what works for you. Swelling and bruising can take several weeks to resolve.  3. It is helpful to take an over-the-counter pain medication regularly for the first few weeks. Choose one of the following that works best for you:  1. Naproxen (Aleve, etc) Two 220mg  tabs twice a day 2. Ibuprofen (Advil, etc) Three 200mg  tabs four times a day (every meal & bedtime) 3. Acetaminophen (Tylenol, etc) 500-650mg  four times a day (every meal & bedtime) 4. A prescription for pain medication (such as oxycodone, hydrocodone, etc) should be given to you upon discharge. Take your pain medication as prescribed.  1. If you are having problems/concerns with the prescription medicine (does not control pain, nausea, vomiting, rash, itching, etc), please call us [phone] to see if we need to switch you to a different pain medicine that will work better for you and/or control your side effect better. 2. If you need a refill on your pain medication, please contact your pharmacy. They will contact our office to request authorization. Prescriptions will not be filled after 5 pm or on week-ends. 4. Avoid getting constipated. Between the surgery and the pain medications, it is common to experience some constipation. Increasing fluid intake and taking a fiber supplement (such as Metamucil, Citrucel, FiberCon, MiraLax, etc) 1-2 times a day regularly will usually help prevent this problem from occurring. A mild laxative (prune juice, Milk of Magnesia, MiraLax, etc) should be taken according to package directions if there are no bowel movements after 48 hours.  5. Watch out for diarrhea. If you have many loose bowel movements, simplify your diet to bland foods & liquids for a few days. Stop any stool softeners and decrease your fiber supplement. Switching to  mild anti-diarrheal medications (Kayopectate, Pepto Bismol) can help. If this worsens or does not improve, please call us. 6. Wash / shower every day. You may shower over the dressings as they are waterproof.  Continue to shower over incision(s) after the dressing is off. 7. Remove your waterproof bandages 5 days after surgery. You may leave the incision open to air. You may replace a dressing/Band-Aid to cover the incision for comfort if you wish.  8. ACTIVITIES as tolerated:  1. You may resume regular (light) daily activities beginning the next day--such as daily self-care, walking, climbing stairs--gradually increasing activities as tolerated. If you can walk 30 minutes without difficulty, it is safe to try more intense activity such as jogging, treadmill, bicycling, low-impact aerobics, swimming, etc. 2. Save the most intensive and strenuous activity for last such as sit-ups, heavy lifting, contact sports, etc Refrain from any heavy lifting or straining until you are off narcotics for pain control.  3. DO NOT PUSH THROUGH PAIN. Let pain be your guide: If it hurts to do something, don't do it. Pain is your body warning you to avoid that activity for another week until the pain goes down. 4. You may drive when you are no longer taking prescription pain medication, you can comfortably wear a seatbelt, and you can safely maneuver your car and apply brakes. 5. You may have sexual intercourse when it is comfortable.  9. FOLLOW UP in our office  1. Please call [facility] at [phone] to set up an appointment to see your surgeon in the office for a follow-up appointment approximately 2-3 weeks after your surgery. 2. Make sure that you call for this appointment the day you arrive home to insure a convenient appointment time.      10. IF YOU HAVE DISABILITY OR FAMILY LEAVE FORMS, BRING THEM TO THE               OFFICE FOR PROCESSING.   WHEN TO CALL us [phone]:  1. Poor pain control 2. Reactions /  problems with new medications (rash/itching, nausea, etc)  3. Fever over 101.5 F (38.5 C) 4. Inability to urinate 5. Nausea and/or vomiting 6. Worsening swelling or bruising 7. Continued bleeding from incision. 8. Increased pain, redness, or drainage from the incision  The clinic staff is available to answer your questions during regular business hours (8:30am-5pm). Please dont hesitate to call and ask to speak to one of our nurses for clinical concerns.  If you have a medical emergency, go to the nearest emergency room or call 911.  A surgeon from [facility] is always on call at the [facility], [place]  [address], [address], [place], [place] [address] ?  MAIN: [phone] ? TOLL FREE: [phone] ?  FAX [phone]  [email or web address]    Appendicitis The appendix is a finger-shaped tube that is attached to the large intestine. Appendicitis is inflammation of the appendix. Without treatment, appendicitis can cause the appendix to tear (rupture). A ruptured appendix can lead to a life-threatening infection. It can also lead to the formation of a painful collection of pus (abscess) in the appendix. What are the causes? This condition may be caused by a blockage in the appendix that leads to infection. The blockage can be due to:  A ball of stool.  Enlarged lymph glands. In some cases, the cause may not be known. What increases the risk? This condition is more likely to develop in people who are 31-90 years of age. What are the signs or symptoms? Symptoms of this condition include:  Pain around the belly button that moves toward the lower right abdomen. The pain can become more  severe as time passes. It gets worse with coughing or sudden movements.  Tenderness in the lower right abdomen.  Nausea.  Vomiting.  Loss of appetite.  Fever.  Constipation.  Diarrhea.  Generally not feeling well. How is this  diagnosed? This condition may be diagnosed with:  A physical exam.  Blood tests.  Urine test. To confirm the diagnosis, an ultrasound, MRI, or CT scan may be done. How is this treated? This condition is usually treated by taking out the appendix (appendectomy). There are two methods for doing an appendectomy:  Open appendectomy. In this surgery, the appendix is removed through a large cut (incision) that is made in the lower right abdomen. This procedure may be recommended if:  You have major scarring from a previous surgery.  You have a bleeding disorder.  You are pregnant and are near term.  You have a condition that makes the laparoscopic procedure impossible, such as an advanced infection or a ruptured appendix.  Laparoscopic appendectomy. In this surgery, the appendix is removed through small incisions. This procedure usually causes less pain and fewer problems than an open appendectomy. It also has a shorter recovery time. If the appendix has ruptured and an abscess has formed, a drain may be placed into the abscess to remove fluid and antibiotic medicines may be given through an IV tube. The appendix may or may not need to be removed. This information is not intended to replace advice given to you by your health care provider. Make sure you discuss any questions you have with your health care provider. Document Released: 11/30/2005 Document Revised: 04/08/2016 Document Reviewed: 04/17/2015 Elsevier Interactive Patient Education  2017 [employer].

## 2017-05-13 DIAGNOSIS — L608 Other nail disorders: Secondary | ICD-10-CM | POA: Diagnosis not present

## 2017-05-20 DIAGNOSIS — D049 Carcinoma in situ of skin, unspecified: Secondary | ICD-10-CM | POA: Diagnosis not present

## 2017-05-25 DIAGNOSIS — D049 Carcinoma in situ of skin, unspecified: Secondary | ICD-10-CM | POA: Diagnosis not present

## 2017-06-29 DIAGNOSIS — Z0000 Encounter for general adult medical examination without abnormal findings: Secondary | ICD-10-CM | POA: Diagnosis not present

## 2017-06-29 DIAGNOSIS — Z136 Encounter for screening for cardiovascular disorders: Secondary | ICD-10-CM | POA: Diagnosis not present

## 2017-06-30 DIAGNOSIS — D049 Carcinoma in situ of skin, unspecified: Secondary | ICD-10-CM | POA: Diagnosis not present

## 2018-02-18 DIAGNOSIS — J020 Streptococcal pharyngitis: Secondary | ICD-10-CM | POA: Diagnosis not present

## 2018-02-18 DIAGNOSIS — Z20828 Contact with and (suspected) exposure to other viral communicable diseases: Secondary | ICD-10-CM | POA: Diagnosis not present

## 2018-02-18 DIAGNOSIS — R5381 Other malaise: Secondary | ICD-10-CM | POA: Diagnosis not present

## 2018-03-25 IMAGING — CT CT ABD-PELV W/ CM
2 of 4 series · 10 of 36 positions shown, 17 images · IV contrast (WATER & [ID] ISOVUE 300)
Comparison: None.

CLINICAL DATA: Right lower quadrant pain for 3 days

EXAM:
CT ABDOMEN AND PELVIS WITH CONTRAST
TECHNIQUE: Multidetector CT imaging of the abdomen and pelvis was performed
using the standard protocol following bolus administration of
intravenous contrast.
CONTRAST:  125mL [id number] IOPAMIDOL ([id number]) INJECTION 61%

[Series 3: abd/pelvis with · axial · 0.74mm/px · z∈[-353,-13]mm · 9 of 85 slices shown, 15 images]
[im 9/85  soft-tissue]
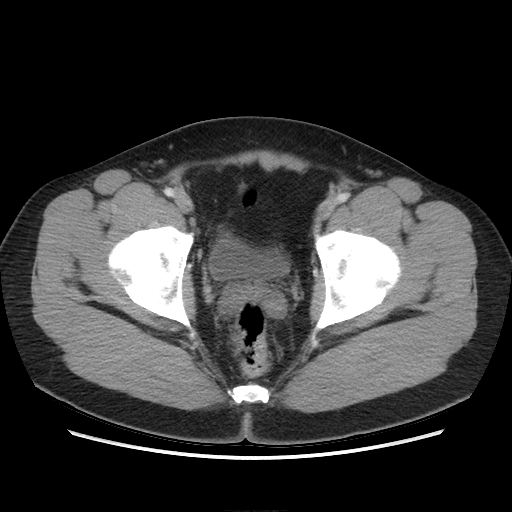
[im 9/85  bone]
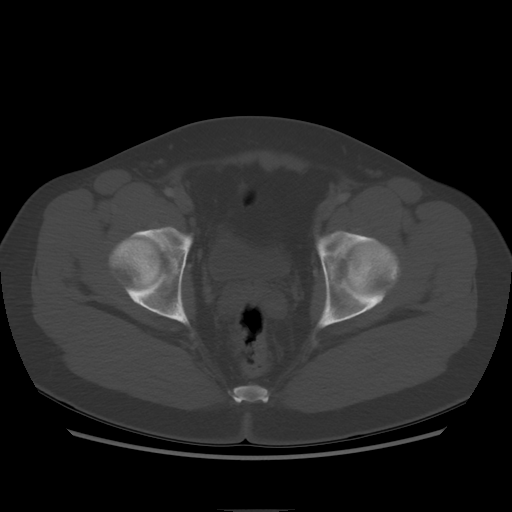
[im 17/85  soft-tissue]
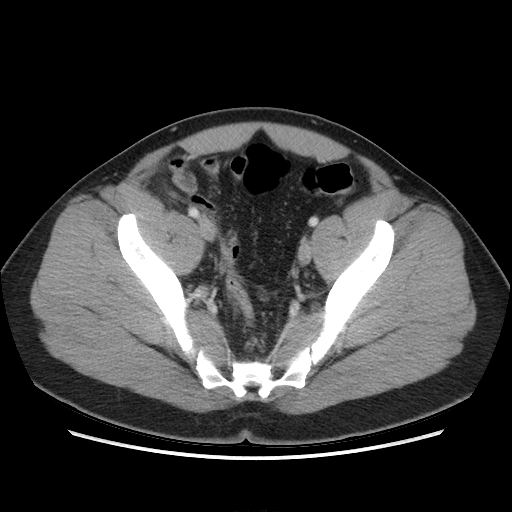
[im 26/85  soft-tissue]
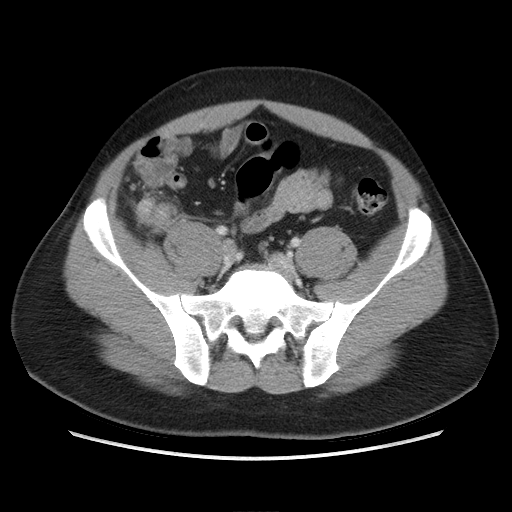
[im 34/85  soft-tissue]
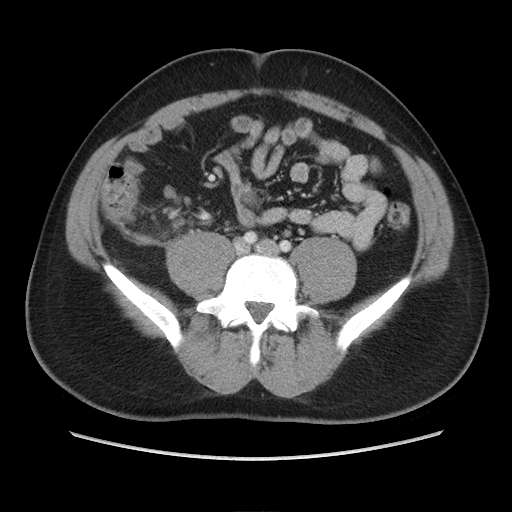
[im 43/85  soft-tissue]
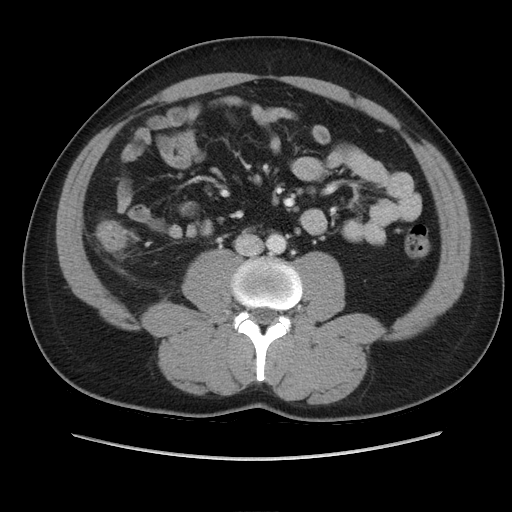
[im 51/85  soft-tissue]
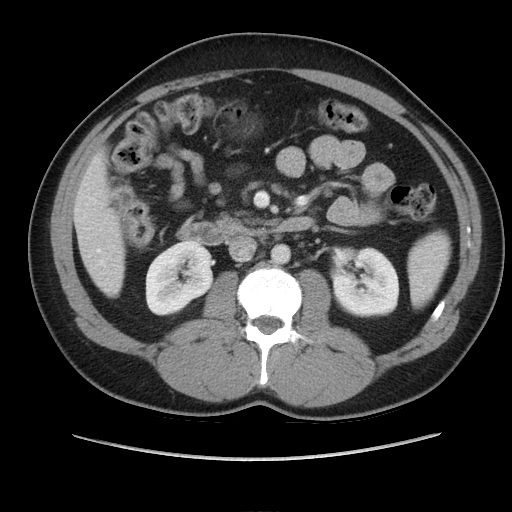
[im 51/85  lung]
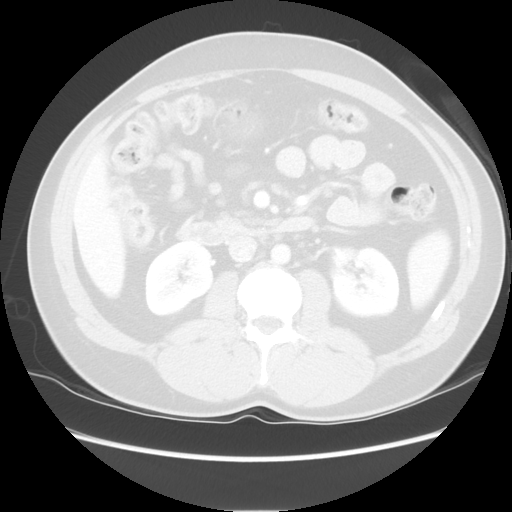
[im 59/85  soft-tissue]
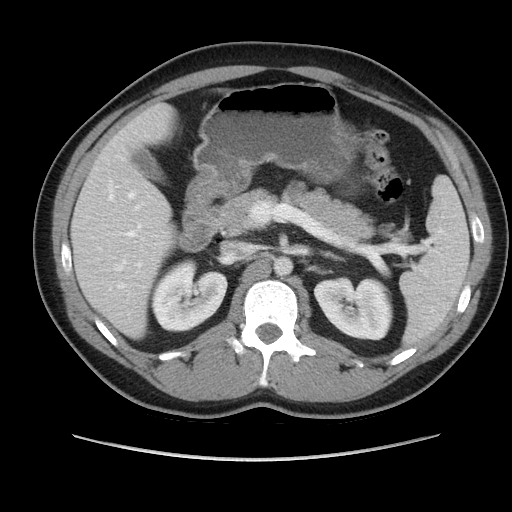
[im 59/85  lung]
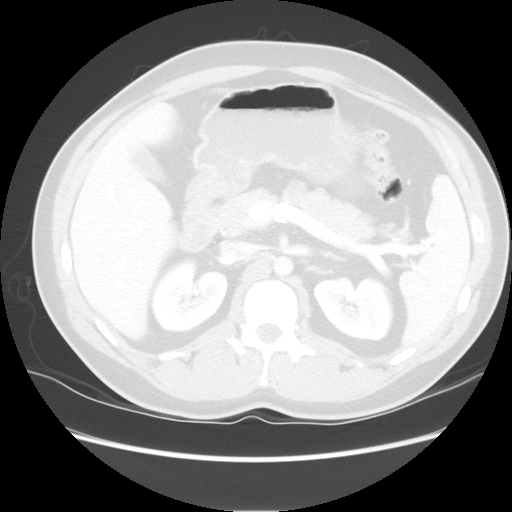
[im 68/85  soft-tissue]
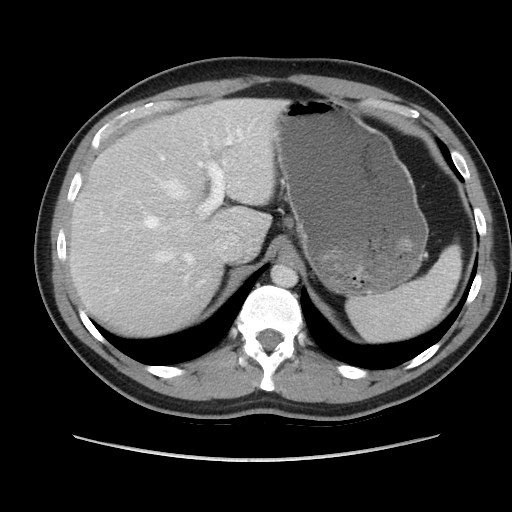
[im 68/85  lung]
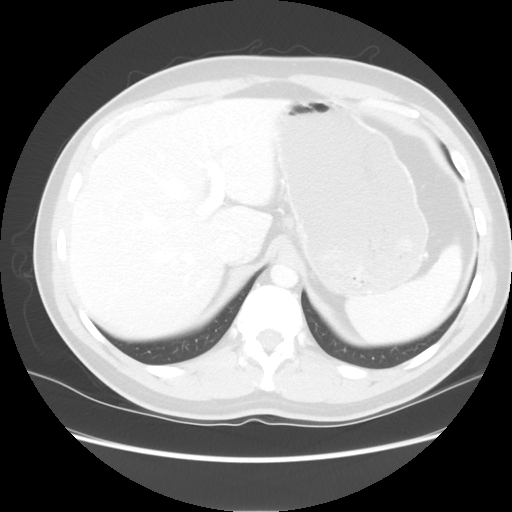
[im 76/85  soft-tissue]
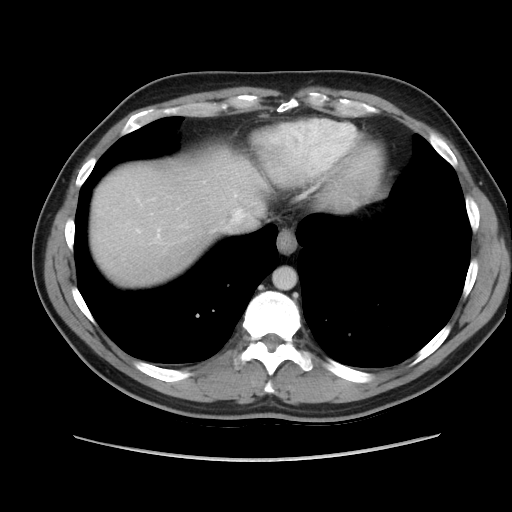
[im 76/85  lung]
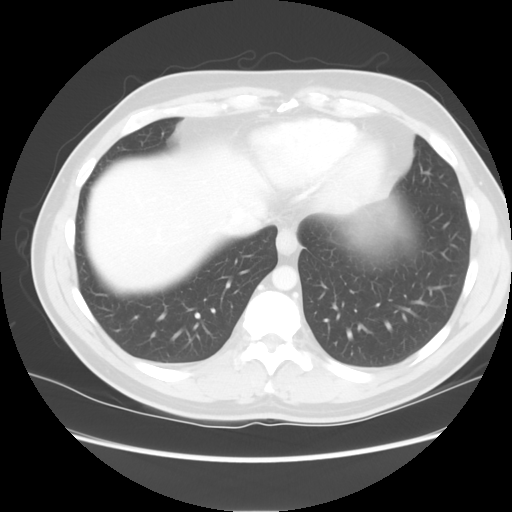
[im 76/85  bone]
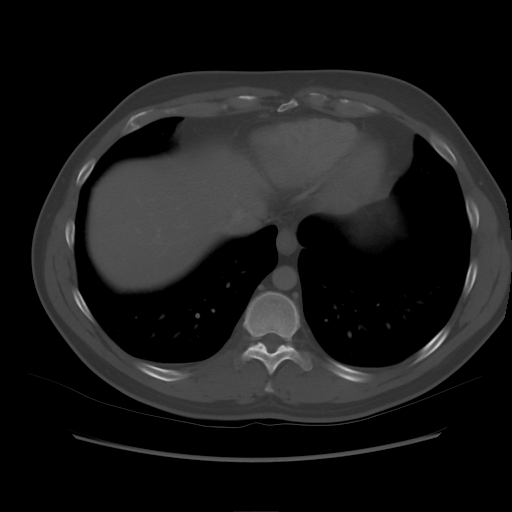

[Series 601: coronal body · coronal · 0.90mm/px · 1 of 125 slices shown, 2 images]
[im 42/125  soft-tissue]
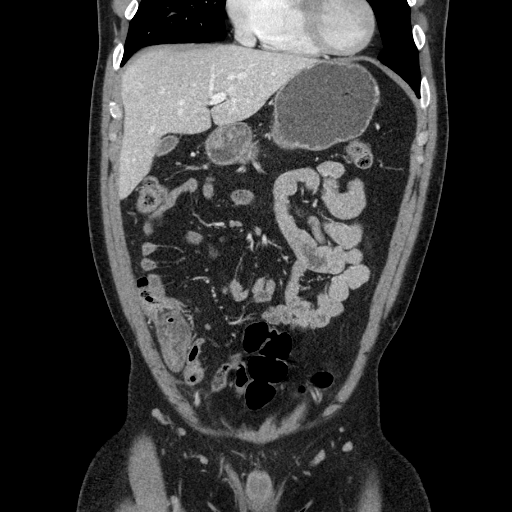
[im 42/125  bone]
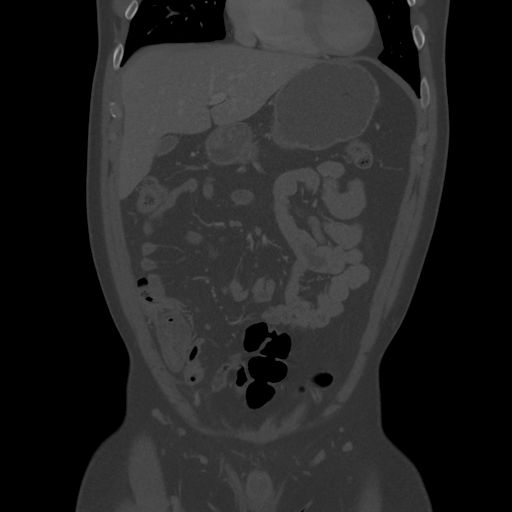

[10 of 36 positions shown; findings below may reference images not displayed]

FINDINGS: Lower chest: Lung bases are clear.

Hepatobiliary: No focal liver lesions are evident. Gallbladder is
mildly contracted without gallbladder wall thickening. No biliary
duct dilatation.

Pancreas: No pancreatic mass or inflammatory focus.

Spleen: No splenic lesions are evident.

Adrenals/Urinary Tract: Adrenals appear normal bilaterally. Kidneys
bilaterally show no evidence of mass or hydronephrosis on either
side. There is no renal or ureteral calculus on either side. Urinary
bladder is midline with wall thickness within normal limits.

Stomach/Bowel: There is no bowel wall or mesenteric thickening. No
bowel obstruction. No free air or portal venous air.

Vascular/Lymphatic: There is no abdominal aortic aneurysm. No
vascular lesion evident. Multiple lymph nodes are noted in the
periappendiceal region, several of which are borderline prominent,
likely due to appendiceal inflammation. No other adenopathy evident
in the abdomen or pelvis.

Reproductive: Prostate and seminal vesicles appear normal in size
and contour. No pelvic mass.

Other: There is diffuse thickening of the appendix with surrounding
mesenteric inflammation/ stranding consistent with acute
appendicitis. There is no well-defined abscess or perforation. There
is no abscess or ascites elsewhere in the abdomen or pelvis.

Musculoskeletal: There are no blastic or lytic bone lesions. There
is no intramuscular or abdominal wall lesion.
IMPRESSION: Evidence of acute appendicitis. No free air. Several prominent lymph
nodes in the periappendiceal region are likely due to the acute
appendicitis. No adenopathy seen elsewhere.

No frank abscess in the abdomen or pelvis.  No bowel obstruction.

No renal or ureteral calculus.  No hydronephrosis.

Critical Value/emergent results were called by telephone at the time
of interpretation on 04/09/2017 at [DATE] to Dr. BENUSHI BISHT ,
who verbally acknowledged these results.

## 2018-03-26 DIAGNOSIS — H9202 Otalgia, left ear: Secondary | ICD-10-CM | POA: Diagnosis not present

## 2018-03-26 DIAGNOSIS — H6690 Otitis media, unspecified, unspecified ear: Secondary | ICD-10-CM | POA: Diagnosis not present

## 2018-07-05 ENCOUNTER — Other Ambulatory Visit: Payer: Self-pay | Admitting: Internal Medicine

## 2018-07-05 DIAGNOSIS — R42 Dizziness and giddiness: Secondary | ICD-10-CM

## 2018-07-05 DIAGNOSIS — Z13220 Encounter for screening for lipoid disorders: Secondary | ICD-10-CM | POA: Diagnosis not present

## 2018-07-05 DIAGNOSIS — Z0000 Encounter for general adult medical examination without abnormal findings: Secondary | ICD-10-CM | POA: Diagnosis not present

## 2018-07-15 ENCOUNTER — Ambulatory Visit
Admission: RE | Admit: 2018-07-15 | Discharge: 2018-07-15 | Disposition: A | Discharge status: Home / Self Care | Payer: 59 | Source: Ambulatory Visit | Attending: Internal Medicine | Admitting: Internal Medicine

## 2018-07-15 DIAGNOSIS — R42 Dizziness and giddiness: Secondary | ICD-10-CM

## 2018-10-25 ENCOUNTER — Ambulatory Visit (HOSPITAL_COMMUNITY)
Admission: RE | Admit: 2018-10-25 | Discharge: 2018-10-25 | Disposition: A | Discharge status: Home / Self Care | Payer: 59 | Source: Ambulatory Visit | Attending: Certified Nurse Midwife | Admitting: Certified Nurse Midwife

## 2018-10-25 DIAGNOSIS — Z1379 Encounter for other screening for genetic and chromosomal anomalies: Secondary | ICD-10-CM | POA: Insufficient documentation

## 2018-10-25 DIAGNOSIS — Z0183 Encounter for blood typing: Secondary | ICD-10-CM | POA: Diagnosis not present

## 2018-10-25 NOTE — MAU Note (Signed)
Pt here today for lab drawn in [facility].  RH genotyping ([id number] drawn and sent to [facility].  Wife (sign other) Trixie RudeLindsey Sandy DOB:[birth year], [id number].

## 2018-10-26 ENCOUNTER — Other Ambulatory Visit (HOSPITAL_COMMUNITY): Payer: Self-pay

## 2022-05-27 NOTE — Progress Notes (Signed)
[name] [address] [place] [address] Phone: [phone] Subjective:   Nathan Shields, am serving as a scribe for Dr. Antoine Primas.  I'm seeing this patient by the request  of:  Renford Dills, MD  CC: left achilles pain  [id number]  Nathan Shields is a 36 y.o. male coming in with complaint of L achilles pain. Injured foot playing basketball but does not recall one incident that would have caused his pain. Took 3 weeks off and tried to play yesterday but was unable to do so due to pain. Pain over distal achilles. Will have soreness after long day.        No past medical history on file. Past Surgical History:  Procedure Laterality Date   LAPAROSCOPIC APPENDECTOMY N/A 04/10/2017   Procedure: APPENDECTOMY LAPAROSCOPIC;  Surgeon: Harriette Bouillon, MD;  Location: [facility] OR;  Service: General;  Laterality: N/A;   TONSILLECTOMY     TYMPANOPLASTY     Social History   Socioeconomic History   Marital status: Married    Spouse name: Not on file   Number of children: Not on file   Years of education: Not on file   Highest education level: Not on file  Occupational History   Not on file  Tobacco Use   Smoking status: Not on file   Smokeless tobacco: Not on file  Substance and Sexual Activity   Alcohol use: No   Drug use: No   Sexual activity: Not on file  Other Topics Concern   Not on file  Social History Narrative   Not on file   Social Determinants of Health   Financial Resource Strain: Not on file  Food Insecurity: Not on file  Transportation Needs: Not on file  Physical Activity: Not on file  Stress: Not on file  Social Connections: Not on file   No Known Allergies No family history on file.    Current Outpatient Medications (Respiratory):    fexofenadine (ALLEGRA) 180 MG tablet, Take 180 mg by mouth daily.  Current Outpatient Medications (Analgesics):    acetaminophen (TYLENOL) 325 MG tablet, Take 2 tablets (650 mg  total) by mouth every 6 (six) hours as needed for mild pain (or temp > 100).   ibuprofen (ADVIL,MOTRIN) 600 MG tablet, Take 1 tablet (600 mg total) by mouth every 6 (six) hours as needed for mild pain.   meloxicam (MOBIC) 15 MG tablet, Take 1 tablet (15 mg total) by mouth daily.   oxyCODONE (OXY IR/ROXICODONE) 5 MG immediate release tablet, Take 1-2 tablets (5-10 mg total) by mouth every 6 (six) hours as needed for moderate pain.   Current Outpatient Medications (Other):    amoxicillin-clavulanate (AUGMENTIN) 875-125 MG tablet, Take 1 tablet by mouth 2 (two) times daily.   Reviewed prior external information including notes and imaging from  primary care provider As well as notes that were available from care everywhere and other healthcare systems.  Past medical history, social, surgical and family history all reviewed in electronic medical record.  No pertanent information unless stated regarding to the chief complaint.   Review of Systems:  No headache, visual changes, nausea, vomiting, diarrhea, constipation, dizziness, abdominal pain, skin rash, fevers, chills, night sweats, weight loss, swollen lymph nodes, body aches, joint swelling, chest pain, shortness of breath, mood changes. POSITIVE muscle aches  Objective  Blood pressure 102/76, pulse 90, height 5\' 8"  (1.727 m), weight 194 lb (88 kg), SpO2 98 %.   General: No apparent  distress alert and oriented x3 mood and affect normal, dressed appropriately.  HEENT: Pupils equal, extraocular movements intact  Respiratory: Patient's speak in full sentences and does not appear short of breath  Cardiovascular: No lower extremity edema, non tender, no erythema  Patient does have significant tightness noted of the Achilles.  Tender to palpation noted in the mid substance of the Achilles tendon.  Mild swelling.  Full strength noted.  Negative Thompson.  Limited muscular skeletal ultrasound was performed and interpreted by Antoine Primas, M   Achilles tendon at the insertion of the calcaneal area does not show any significant changes.  2 cm of proximal to the area there is some hypoechoic changes and does have some mild increase in neovascularization that is consistent with intersubstance tearing noted. Impression: Severe Achilles tendinitis with possible intersubstance tearing  [id number]; 15 additional minutes spent for Therapeutic exercises as stated in above notes.  This included exercises focusing on stretching, strengthening, with significant focus on eccentric aspects.   Long term goals include an improvement in range of motion, strength, endurance as well as avoiding reinjury. Patient's frequency would include in 1-2 times a day, 3-5 times a week for a duration of 6-12 weeks. Ankle strengthening that included:  Basic range of motion exercises to allow proper full motion at ankle Stretching of the lower leg and hamstrings  Theraband exercises for the lower leg - inversion, eversion, dorsiflexion and plantarflexion each to be completed with a theraband Balance exercises to increase proprioception Weight bearing exercises to increase strength and balance   Proper technique shown and discussed handout in great detail with ATC.  All questions were discussed and answered.      Impression and Recommendations:

## 2022-05-28 ENCOUNTER — Ambulatory Visit: Payer: 59 | Admitting: Family Medicine

## 2022-05-28 ENCOUNTER — Ambulatory Visit: Payer: Self-pay

## 2022-05-28 ENCOUNTER — Encounter: Payer: Self-pay | Admitting: Family Medicine

## 2022-05-28 VITALS — BP 102/76 | HR 90 | Ht 68.0 in | Wt 194.0 lb

## 2022-05-28 DIAGNOSIS — M7662 Achilles tendinitis, left leg: Secondary | ICD-10-CM

## 2022-05-28 DIAGNOSIS — M25572 Pain in left ankle and joints of left foot: Secondary | ICD-10-CM

## 2022-05-28 DIAGNOSIS — M7660 Achilles tendinitis, unspecified leg: Secondary | ICD-10-CM | POA: Insufficient documentation

## 2022-05-28 MED ORDER — MELOXICAM 15 MG PO TABS: 15.0000 mg | ORAL_TABLET | Freq: Every day | ORAL | 0 refills | Status: DC

## 2022-05-28 NOTE — Patient Instructions (Addendum)
Good to see you! Meloxicam 15 mg for 10 days then as needed  Avoid being barefoot when possible Congrats on baby #4 See you again in 6-8 weeks

## 2022-05-28 NOTE — Assessment & Plan Note (Signed)
New acute onset.  Meloxicam given.  Will be beneficial.  Patient is able to stop or hold on any type of basketball for the next 4 weeks.  Follow-up with me again in 6 to 8 weeks to further evaluate.  Discussed icing regimen.

## 2022-07-08 NOTE — Progress Notes (Unsigned)
[facility] [address] [place] [address] Phone: [phone] Subjective:   [name], am serving as a scribe for Dr. Antoine Primas.   I'm seeing this patient by the request  of:  Renford Dills, MD  CC: Ankle pain follow-up  [id number]  05/28/2022 New acute onset.  Meloxicam given.  Will be beneficial.  Patient is able to stop or hold on any type of basketball for the next 4 weeks.  Follow-up with me again in 6 to 8 weeks to further evaluate.  Discussed icing regimen.  Updated 07/09/2022 Nathan Shields is a 36 y.o. male coming in with complaint of L achilles tendonitis. Patient states that his pain is the same. Has continued to be active.  Pain in R achilles now as well. Tightness in distal aspect. Has not ever had this issue prior to L ankle pain.        No past medical history on file. Past Surgical History:  Procedure Laterality Date   LAPAROSCOPIC APPENDECTOMY N/A 04/10/2017   Procedure: APPENDECTOMY LAPAROSCOPIC;  Surgeon: Harriette Bouillon, MD;  Location: [facility] OR;  Service: General;  Laterality: N/A;   TONSILLECTOMY     TYMPANOPLASTY     Social History   Socioeconomic History   Marital status: Married    Spouse name: Not on file   Number of children: Not on file   Years of education: Not on file   Highest education level: Not on file  Occupational History   Not on file  Tobacco Use   Smoking status: Not on file   Smokeless tobacco: Not on file  Substance and Sexual Activity   Alcohol use: No   Drug use: No   Sexual activity: Not on file  Other Topics Concern   Not on file  Social History Narrative   Not on file   Social Determinants of Health   Financial Resource Strain: Not on file  Food Insecurity: Not on file  Transportation Needs: Not on file  Physical Activity: Not on file  Stress: Not on file  Social Connections: Not on file   No Known Allergies No family history on file.    Current Outpatient  Medications (Respiratory):    fexofenadine (ALLEGRA) 180 MG tablet, Take 180 mg by mouth daily.  Current Outpatient Medications (Analgesics):    acetaminophen (TYLENOL) 325 MG tablet, Take 2 tablets (650 mg total) by mouth every 6 (six) hours as needed for mild pain (or temp > 100).   ibuprofen (ADVIL,MOTRIN) 600 MG tablet, Take 1 tablet (600 mg total) by mouth every 6 (six) hours as needed for mild pain.   meloxicam (MOBIC) 15 MG tablet, Take 1 tablet (15 mg total) by mouth daily.   oxyCODONE (OXY IR/ROXICODONE) 5 MG immediate release tablet, Take 1-2 tablets (5-10 mg total) by mouth every 6 (six) hours as needed for moderate pain.   Current Outpatient Medications (Other):    amoxicillin-clavulanate (AUGMENTIN) 875-125 MG tablet, Take 1 tablet by mouth 2 (two) times daily.   Reviewed prior external information including notes and imaging from  primary care provider As well as notes that were available from care everywhere and other healthcare systems.  Past medical history, social, surgical and family history all reviewed in electronic medical record.  No pertanent information unless stated regarding to the chief complaint.   Review of Systems:  No headache, visual changes, nausea, vomiting, diarrhea, constipation, dizziness, abdominal pain, skin rash, fevers, chills, night sweats, weight loss, swollen  lymph nodes, body aches, joint swelling, chest pain, shortness of breath, mood changes. POSITIVE muscle aches  Objective  Blood pressure 96/72, pulse 60, height 5\' 8"  (1.727 m), weight 198 lb (89.8 kg), SpO2 98 %.   General: No apparent distress alert and oriented x3 mood and affect normal, dressed appropriately.  HEENT: Pupils equal, extraocular movements intact  Respiratory: Patient's speak in full sentences and does not appear short of breath  Cardiovascular: No lower extremity edema, non tender, no erythema  Right ankle exam shows some tenderness over the peroneal tendons bilaterally  but right greater than left, audible popping.  No instability of the ankle.  Patient's left Achilles significantly improved with range of motion, no significant tenderness.  Limited muscular skeletal ultrasound was performed and interpreted by , M   Limited ultrasound of patient's left Achilles has no significant hypoechoic changes or increasing Doppler flow.  Patient's right ankle does have some hypoechoic changes and mild what appears to be subluxation of the peroneal tendons noted.  No increase in Doppler flow.  No signs of any type of tearing aspect with hypoechoic changes Impression: Achilles tendinitis improvement, peroneal tendinitis of the right ankle     Impression and Recommendations:     The above documentation has been reviewed and is accurate and complete Antoine Primas, DO

## 2022-07-09 ENCOUNTER — Ambulatory Visit (INDEPENDENT_AMBULATORY_CARE_PROVIDER_SITE_OTHER): Payer: 59 | Admitting: Family Medicine

## 2022-07-09 ENCOUNTER — Encounter: Payer: Self-pay | Admitting: Family Medicine

## 2022-07-09 ENCOUNTER — Ambulatory Visit: Payer: Self-pay

## 2022-07-09 VITALS — BP 96/72 | HR 60 | Ht 68.0 in | Wt 198.0 lb

## 2022-07-09 DIAGNOSIS — M25572 Pain in left ankle and joints of left foot: Secondary | ICD-10-CM

## 2022-07-09 DIAGNOSIS — M7671 Peroneal tendinitis, right leg: Secondary | ICD-10-CM | POA: Diagnosis not present

## 2022-07-09 DIAGNOSIS — M7662 Achilles tendinitis, left leg: Secondary | ICD-10-CM

## 2022-07-09 NOTE — Assessment & Plan Note (Signed)
Peroneal tendinitis.  Patient given heel lift and some mild home exercises.  Do not think bracing is necessary.  Meloxicam for breakthrough pain follow-up again in 6 to 8 weeks

## 2022-07-09 NOTE — Assessment & Plan Note (Signed)
Patient has made significant improvement.  Did have some mild peroneal subluxation on the contralateral side.  Helix ordered and put in his shoes today.  We will see if that makes some improvement.  Increase activity slowly otherwise.  Follow-up with me again in 6 to 8 weeks.  Worsening pain will need to consider formal physical therapy.  I believe the patient will continue to make improvement

## 2022-07-09 NOTE — Patient Instructions (Addendum)
Good to see you! Do prescribed exercises at least 3x a week Heel lifts  Ice after activity See you again in 6 weeks

## 2022-08-13 NOTE — Progress Notes (Deleted)
[facility] [address] [place] [address] Phone: [phone] Subjective:    I'm seeing this patient by the request  of:  Renford Dills, MD  CC:   [id number]  07/09/2022 Peroneal tendinitis.  Patient given heel lift and some mild home exercises.  Do not think bracing is necessary.  Meloxicam for breakthrough pain follow-up again in 6 to 8 weeks  Patient has made significant improvement.  Did have some mild peroneal subluxation on the contralateral side.  Helix ordered and put in his shoes today.  We will see if that makes some improvement.  Increase activity slowly otherwise.  Follow-up with me again in 6 to 8 weeks.  Worsening pain will need to consider formal physical therapy.  I believe the patient will continue to make improvement  Updated 08/20/2022 Nathan Shields is a 36 y.o. male coming in with complaint of achilles pain       No past medical history on file. Past Surgical History:  Procedure Laterality Date   LAPAROSCOPIC APPENDECTOMY N/A 04/10/2017   Procedure: APPENDECTOMY LAPAROSCOPIC;  Surgeon: Harriette Bouillon, MD;  Location: [facility] OR;  Service: General;  Laterality: N/A;   TONSILLECTOMY     TYMPANOPLASTY     Social History   Socioeconomic History   Marital status: Married    Spouse name: Not on file   Number of children: Not on file   Years of education: Not on file   Highest education level: Not on file  Occupational History   Not on file  Tobacco Use   Smoking status: Not on file   Smokeless tobacco: Not on file  Substance and Sexual Activity   Alcohol use: No   Drug use: No   Sexual activity: Not on file  Other Topics Concern   Not on file  Social History Narrative   Not on file   Social Determinants of Health   Financial Resource Strain: Not on file  Food Insecurity: Not on file  Transportation Needs: Not on file  Physical Activity: Not on file  Stress: Not on file  Social Connections: Not on file   No  Known Allergies No family history on file.    Current Outpatient Medications (Respiratory):    fexofenadine (ALLEGRA) 180 MG tablet, Take 180 mg by mouth daily.  Current Outpatient Medications (Analgesics):    acetaminophen (TYLENOL) 325 MG tablet, Take 2 tablets (650 mg total) by mouth every 6 (six) hours as needed for mild pain (or temp > 100).   ibuprofen (ADVIL,MOTRIN) 600 MG tablet, Take 1 tablet (600 mg total) by mouth every 6 (six) hours as needed for mild pain.   meloxicam (MOBIC) 15 MG tablet, Take 1 tablet (15 mg total) by mouth daily.   oxyCODONE (OXY IR/ROXICODONE) 5 MG immediate release tablet, Take 1-2 tablets (5-10 mg total) by mouth every 6 (six) hours as needed for moderate pain.   Current Outpatient Medications (Other):    amoxicillin-clavulanate (AUGMENTIN) 875-125 MG tablet, Take 1 tablet by mouth 2 (two) times daily.   Reviewed prior external information including notes and imaging from  primary care provider As well as notes that were available from care everywhere and other healthcare systems.  Past medical history, social, surgical and family history all reviewed in electronic medical record.  No pertanent information unless stated regarding to the chief complaint.   Review of Systems:  No headache, visual changes, nausea, vomiting, diarrhea, constipation, dizziness, abdominal pain, skin rash, fevers, chills, night sweats,  weight loss, swollen lymph nodes, body aches, joint swelling, chest pain, shortness of breath, mood changes. POSITIVE muscle aches  Objective  There were no vitals taken for this visit.   General: No apparent distress alert and oriented x3 mood and affect normal, dressed appropriately.  HEENT: Pupils equal, extraocular movements intact  Respiratory: Patient's speak in full sentences and does not appear short of breath  Cardiovascular: No lower extremity edema, non tender, no erythema      Impression and Recommendations:

## 2022-08-20 ENCOUNTER — Ambulatory Visit: Payer: 59 | Admitting: Family Medicine

## 2022-09-18 NOTE — Progress Notes (Deleted)
[name] Phone: [phone] Subjective:    I'm seeing this patient by the request  of:  Seward Carol, MD  CC:   [id number]  07/09/2022 Peroneal tendinitis.  Patient given heel lift and some mild home exercises.  Do not think bracing is necessary.  Meloxicam for breakthrough pain follow-up again in 6 to 8 weeks  Patient has made significant improvement.  Did have some mild peroneal subluxation on the contralateral side.  Helix ordered and put in his shoes today.  We will see if that makes some improvement.  Increase activity slowly otherwise.  Follow-up with me again in 6 to 8 weeks.  Worsening pain will need to consider formal physical therapy.  I believe the patient will continue to make improvement  Updated 09/21/2022 Nathan Shields is a 36 y.o. male coming in with complaint of L ankle pain       No past medical history on file. Past Surgical History:  Procedure Laterality Date   LAPAROSCOPIC APPENDECTOMY N/A 04/10/2017   Procedure: APPENDECTOMY LAPAROSCOPIC;  Surgeon: Erroll Luna, MD;  Location: [facility];  Service: General;  Laterality: N/A;   TONSILLECTOMY     TYMPANOPLASTY     Social History   Socioeconomic History   Marital status: Married    Spouse name: Not on file   Number of children: Not on file   Years of education: Not on file   Highest education level: Not on file  Occupational History   Not on file  Tobacco Use   Smoking status: Not on file   Smokeless tobacco: Not on file  Substance and Sexual Activity   Alcohol use: No   Drug use: No   Sexual activity: Not on file  Other Topics Concern   Not on file  Social History Narrative   Not on file   Social Determinants of Health   Financial Resource Strain: Not on file  Food Insecurity: Not on file  Transportation Needs: Not on file  Physical Activity: Not on file  Stress: Not on file  Social Connections: Not on file   No  Known Allergies No family history on file.    Current Outpatient Medications (Respiratory):    fexofenadine (ALLEGRA) 180 MG tablet, Take 180 mg by mouth daily.  Current Outpatient Medications (Analgesics):    acetaminophen (TYLENOL) 325 MG tablet, Take 2 tablets (650 mg total) by mouth every 6 (six) hours as needed for mild pain (or temp > 100).   ibuprofen (ADVIL,MOTRIN) 600 MG tablet, Take 1 tablet (600 mg total) by mouth every 6 (six) hours as needed for mild pain.   meloxicam (MOBIC) 15 MG tablet, Take 1 tablet (15 mg total) by mouth daily.   oxyCODONE (OXY IR/ROXICODONE) 5 MG immediate release tablet, Take 1-2 tablets (5-10 mg total) by mouth every 6 (six) hours as needed for moderate pain.   Current Outpatient Medications (Other):    amoxicillin-clavulanate (AUGMENTIN) 875-125 MG tablet, Take 1 tablet by mouth 2 (two) times daily.   Reviewed prior external information including notes and imaging from  primary care provider As well as notes that were available from care everywhere and other healthcare systems.  Past medical history, social, surgical and family history all reviewed in electronic medical record.  No pertanent information unless stated regarding to the chief complaint.   Review of Systems:  No headache, visual changes, nausea, vomiting, diarrhea, constipation, dizziness, abdominal pain, skin rash, fevers, chills, night  sweats, weight loss, swollen lymph nodes, body aches, joint swelling, chest pain, shortness of breath, mood changes. POSITIVE muscle aches  Objective  There were no vitals taken for this visit.   General: No apparent distress alert and oriented x3 mood and affect normal, dressed appropriately.  HEENT: Pupils equal, extraocular movements intact  Respiratory: Patient's speak in full sentences and does not appear short of breath  Cardiovascular: No lower extremity edema, non tender, no erythema      Impression and Recommendations:

## 2022-09-21 ENCOUNTER — Ambulatory Visit: Payer: 59 | Admitting: Family Medicine

## 2024-07-18 NOTE — Progress Notes (Unsigned)
    [facility] [address] [place] [address] Phone: [phone]   Assessment and Plan:     There are no diagnoses linked to this encounter.  ***   Pertinent previous records reviewed include ***    Follow Up: ***     Subjective:   I, Nathan Shields, am serving as a Neurosurgeon for Doctor Nathan Shields  Chief Complaint: right calf and ankle pain   HPI:   07/19/2024 Patient is a 38 year old male with right calf and ankle pain. Patient states  Relevant Historical Information: ***  Additional pertinent review of systems negative.   Current Outpatient Medications:    acetaminophen  (TYLENOL ) 325 MG tablet, Take 2 tablets (650 mg total) by mouth every 6 (six) hours as needed for mild pain (or temp > 100)., Disp: , Rfl:    amoxicillin -clavulanate (AUGMENTIN ) 875-125 MG tablet, Take 1 tablet by mouth 2 (two) times daily., Disp: 10 tablet, Rfl: 1   fexofenadine (ALLEGRA) 180 MG tablet, Take 180 mg by mouth daily., Disp: , Rfl:    ibuprofen  (ADVIL ,MOTRIN ) 600 MG tablet, Take 1 tablet (600 mg total) by mouth every 6 (six) hours as needed for mild pain., Disp: 30 tablet, Rfl: 0   meloxicam  (MOBIC ) 15 MG tablet, Take 1 tablet (15 mg total) by mouth daily., Disp: 30 tablet, Rfl: 0   oxyCODONE  (OXY IR/ROXICODONE ) 5 MG immediate release tablet, Take 1-2 tablets (5-10 mg total) by mouth every 6 (six) hours as needed for moderate pain., Disp: 15 tablet, Rfl: 0   Objective:     There were no vitals filed for this visit.    There is no height or weight on file to calculate BMI.    Physical Exam:    ***   Electronically signed by:  Nathan Shields [facility] 11:58 AM 07/18/24

## 2024-07-19 ENCOUNTER — Ambulatory Visit: Admitting: Sports Medicine

## 2024-07-19 VITALS — BP 136/84 | HR 68 | Ht 68.0 in | Wt 194.0 lb

## 2024-07-19 DIAGNOSIS — M7661 Achilles tendinitis, right leg: Secondary | ICD-10-CM

## 2024-07-19 MED ORDER — MELOXICAM 15 MG PO TABS: 15.0000 mg | ORAL_TABLET | Freq: Every day | ORAL | 0 refills | Status: DC

## 2024-07-19 MED ORDER — MELOXICAM 15 MG PO TABS: ORAL_TABLET | ORAL | 0 refills | Status: AC

## 2024-07-19 NOTE — Patient Instructions (Signed)
 Achilles/ calf HEP   - Start meloxicam  15 mg daily x2 weeks.  If still having pain after 2 weeks, complete 3rd-week of NSAID. May use remaining NSAID as needed once daily for pain control.  Do not to use additional over-the-counter NSAIDs (ibuprofen , naproxen, Advil , Aleve, etc.) while taking prescription NSAIDs.  May use Tylenol  [phone] mg 2 to 3 times a day for breakthrough pain.  Advance physical activity as tolerated  As needed follow up if no improvement 3-4 week follow up

## 2024-08-15 ENCOUNTER — Ambulatory Visit: Admitting: Family Medicine

## 2024-08-17 NOTE — Progress Notes (Unsigned)
 Nathan Shields [facility] [address] [place] [address] Phone: [phone] Subjective:   Nathan Shields am a scribe for Dr. Claudene.   I'm seeing this patient by the request  of:  Nathan Ingle, MD  CC: Right heel pain  Nathan Shields  Nathan Shields is a 38 y.o. male coming in with complaint of R heel pain. Saw Dr. Leonce for achilles pain in August.  Was given meloxicam  and home exercises.  Patient states that it started about two and a half months ago. Playing basketball in a league in the morning and not sure if he overworked it then. Pain doesn't wake him up at night. It only hurts when he is active (running jogging and some times while walking). ROM is not limited. There is a feeling of tightness sometimes when he walks. Not currently taking any pain medication.      No past medical history on file. Past Surgical History:  Procedure Laterality Date   LAPAROSCOPIC APPENDECTOMY N/A 04/10/2017   Procedure: APPENDECTOMY LAPAROSCOPIC;  Surgeon: Nathan Shipper, MD;  Location: [facility] OR;  Service: General;  Laterality: N/A;   TONSILLECTOMY     TYMPANOPLASTY     Social History   Socioeconomic History   Marital status: Married    Spouse name: Not on file   Number of children: Not on file   Years of education: Not on file   Highest education level: Not on file  Occupational History   Not on file  Tobacco Use   Smoking status: Not on file   Smokeless tobacco: Not on file  Substance and Sexual Activity   Alcohol use: No   Drug use: No   Sexual activity: Not on file  Other Topics Concern   Not on file  Social History Narrative   Not on file   Social Drivers of Health   Financial Resource Strain: Not on file  Food Insecurity: Not on file  Transportation Needs: Not on file  Physical Activity: Not on file  Stress: Not on file  Social Connections: Not on file   No Known Allergies No family history on file.    Current Outpatient Medications  (Respiratory):    fexofenadine (ALLEGRA) 180 MG tablet, Take 180 mg by mouth daily.  Current Outpatient Medications (Analgesics):    acetaminophen  (TYLENOL ) 325 MG tablet, Take 2 tablets (650 mg total) by mouth every 6 (six) hours as needed for mild pain (or temp > 100).   ibuprofen  (ADVIL ,MOTRIN ) 600 MG tablet, Take 1 tablet (600 mg total) by mouth every 6 (six) hours as needed for mild pain.   meloxicam  (MOBIC ) 15 MG tablet, Take 1 tablet daily for 2 weeks.  If still in pain after 2 weeks, take 1 tablet daily for an additional 1 week.   oxyCODONE  (OXY IR/ROXICODONE ) 5 MG immediate release tablet, Take 1-2 tablets (5-10 mg total) by mouth every 6 (six) hours as needed for moderate pain.   Current Outpatient Medications (Other):    amoxicillin -clavulanate (AUGMENTIN ) 875-125 MG tablet, Take 1 tablet by mouth 2 (two) times daily.   Reviewed prior external information including notes and imaging from  primary care provider As well as notes that were available from care everywhere and other healthcare systems.  Past medical history, social, surgical and family history all reviewed in electronic medical record.  No pertanent information unless stated regarding to the chief complaint.   Review of Systems:  No headache, visual changes, nausea, vomiting, diarrhea, constipation, dizziness, abdominal  pain, skin rash, fevers, chills, night sweats, weight loss, swollen lymph nodes, body aches, joint swelling, chest pain, shortness of breath, mood changes. POSITIVE muscle aches  Objective  There were no vitals taken for this visit.   General: No apparent distress alert and oriented x3 mood and affect normal, dressed appropriately.  HEENT: Pupils equal, extraocular movements intact  Respiratory: Patient's speak in full sentences and does not appear short of breath  Cardiovascular: No lower extremity edema, non tender, no erythema  Right ankle exam shows patient does have some tenderness to palpation  peroneal tendon and L-spine mild swelling noted just posterior to the lateral malleolus.  Patient neuro vastly intact.  Good strength noted but more tenderness with resisted eversion of the ankle.   Limited muscular skeletal ultrasound was performed and interpreted by Shields HUSSAR, M  Limited ultrasound shows that there is hypoechoic changes with increasing in neovascularization in the is consistent with a right tear of the peroneal tendon. Impression: Peroneal tendon tear    Impression and Recommendations:    The above documentation has been reviewed and is accurate and complete [name], DO

## 2024-08-18 ENCOUNTER — Ambulatory Visit: Admitting: Family Medicine

## 2024-08-18 ENCOUNTER — Other Ambulatory Visit: Payer: Self-pay

## 2024-08-18 ENCOUNTER — Encounter: Payer: Self-pay | Admitting: Family Medicine

## 2024-08-18 VITALS — BP 108/70 | HR 73 | Ht 68.0 in | Wt 193.0 lb

## 2024-08-18 DIAGNOSIS — S86311A Strain of muscle(s) and tendon(s) of peroneal muscle group at lower leg level, right leg, initial encounter: Secondary | ICD-10-CM

## 2024-08-18 DIAGNOSIS — M79671 Pain in right foot: Secondary | ICD-10-CM

## 2024-08-18 MED ORDER — NITROGLYCERIN 0.2 MG/HR TD PT24: MEDICATED_PATCH | TRANSDERMAL | 0 refills | Status: AC

## 2024-08-18 NOTE — Patient Instructions (Addendum)
 Oofos in the house. HEP. Wear the brace daily. Nitroglycerin  Protocol   Apply 1/4 nitroglycerin  patch to affected area daily.  Change position of patch within the affected area every 24 hours.  You may experience a headache during the first 1-2 weeks of using the patch, these should subside.  If you experience headaches after beginning nitroglycerin  patch treatment, you may take your preferred over the counter pain reliever.  Another side effect of the nitroglycerin  patch is skin irritation or rash related to patch adhesive.  Please notify our office if you develop more severe headaches or rash, and stop the patch.  Tendon healing with nitroglycerin  patch may require 12 to 24 weeks depending on the extent of injury.  Men should not use if taking Viagra, Cialis, or Levitra.   Do not use if you have migraines or rosacea.  See me again in 6 to 8 weeks.

## 2024-08-18 NOTE — Assessment & Plan Note (Signed)
 Tear noted, just posterior to the lateral malleolus.       Follow-up with me again

## 2024-09-28 NOTE — Progress Notes (Signed)
 [facility] [address] [place] [address] Phone: [phone] Subjective:   Nathan Shields am a scribe for Dr. Claudene.   I'm seeing this patient by the request  of:  Nathan Ingle, MD  CC: Left heel and ankle pain follow-up  YEP:Dlagzrupcz  08/18/2024 Tear noted, just posterior to the lateral malleolus.     Update 09/29/2024 Nathan Shields is a 38 y.o. male coming in with complaint of L heel pain.  Seen previously and had more of a peroneal tendon tear noted.  Was to do conservative therapy.  Was to start nitroglycerin  patches and meloxicam .  Patient states that he is feeling ok today. Patient started the patches but not the meloxicam . Tried to do some playing around after using the patches and still had some pain.        No past medical history on file. Past Surgical History:  Procedure Laterality Date   LAPAROSCOPIC APPENDECTOMY N/A 04/10/2017   Procedure: APPENDECTOMY LAPAROSCOPIC;  Surgeon: Nathan Shipper, MD;  Location: [facility] OR;  Service: General;  Laterality: N/A;   TONSILLECTOMY     TYMPANOPLASTY     Social History   Socioeconomic History   Marital status: Married    Spouse name: Not on file   Number of children: Not on file   Years of education: Not on file   Highest education level: Not on file  Occupational History   Not on file  Tobacco Use   Smoking status: Not on file   Smokeless tobacco: Not on file  Substance and Sexual Activity   Alcohol use: No   Drug use: No   Sexual activity: Not on file  Other Topics Concern   Not on file  Social History Narrative   Not on file   Social Drivers of Health   Financial Resource Strain: Not on file  Food Insecurity: Not on file  Transportation Needs: Not on file  Physical Activity: Not on file  Stress: Not on file  Social Connections: Not on file   No Known Allergies No family history on file.   Current Outpatient Medications (Cardiovascular):    nitroGLYCERIN   (NITRO-DUR ) 0.2 mg/hr patch, Apply 1/4 of a patch to skin once daily.  Current Outpatient Medications (Respiratory):    fexofenadine (ALLEGRA) 180 MG tablet, Take 180 mg by mouth daily.  Current Outpatient Medications (Analgesics):    acetaminophen  (TYLENOL ) 325 MG tablet, Take 2 tablets (650 mg total) by mouth every 6 (six) hours as needed for mild pain (or temp > 100).   ibuprofen  (ADVIL ,MOTRIN ) 600 MG tablet, Take 1 tablet (600 mg total) by mouth every 6 (six) hours as needed for mild pain.   meloxicam  (MOBIC ) 15 MG tablet, Take 1 tablet daily for 2 weeks.  If still in pain after 2 weeks, take 1 tablet daily for an additional 1 week.   oxyCODONE  (OXY IR/ROXICODONE ) 5 MG immediate release tablet, Take 1-2 tablets (5-10 mg total) by mouth every 6 (six) hours as needed for moderate pain. (Patient not taking: Reported on 08/18/2024)   Current Outpatient Medications (Other):    amoxicillin -clavulanate (AUGMENTIN ) 875-125 MG tablet, Take 1 tablet by mouth 2 (two) times daily.   Reviewed prior external information including notes and imaging from  primary care provider As well as notes that were available from care everywhere and other healthcare systems.  Past medical history, social, surgical and family history all reviewed in electronic medical record.  No pertanent information unless stated regarding  to the chief complaint.   Review of Systems:  No headache, visual changes, nausea, vomiting, diarrhea, constipation, dizziness, abdominal pain, skin rash, fevers, chills, night sweats, weight loss, swollen lymph nodes, body aches, joint swelling, chest pain, shortness of breath, mood changes. POSITIVE muscle aches  Objective  Blood pressure 118/70, pulse 68, height 5' 8 (1.727 m), weight 195 lb 12.8 oz (88.8 kg), SpO2 98%.   General: No apparent distress alert and oriented x3 mood and affect normal, dressed appropriately.  HEENT: Pupils equal, extraocular movements intact  Respiratory:  Patient's speak in full sentences and does not appear short of breath  Cardiovascular: No lower extremity edema, non tender, no erythema  Ankle exam shows patient does have improvement in range of motion, 5 out of 5 strength noted.  Very minimal discomfort noted over the peroneal tendons on the lateral aspect.  Limited muscular skeletal ultrasound was performed and interpreted by Nathan Shields, M  Limited ultrasound shows good scar tissue formation noted but no new tendon formation noted.  Patient still has 1 area of increased neovascularization that could have some intersubstance tearing noted just inferior and anterior to the lateral malleolus.  Posterior malleolus is unremarkable.  No hypoechoic changes within the cysts ankle mortise.    Impression and Recommendations:    The above documentation has been reviewed and is accurate and complete Nathan Cadiente M Merita Hawks, DO

## 2024-09-29 ENCOUNTER — Other Ambulatory Visit: Payer: Self-pay

## 2024-09-29 ENCOUNTER — Encounter: Payer: Self-pay | Admitting: Family Medicine

## 2024-09-29 ENCOUNTER — Ambulatory Visit: Admitting: Family Medicine

## 2024-09-29 VITALS — BP 118/70 | HR 68 | Ht 68.0 in | Wt 195.8 lb

## 2024-09-29 DIAGNOSIS — S86311A Strain of muscle(s) and tendon(s) of peroneal muscle group at lower leg level, right leg, initial encounter: Secondary | ICD-10-CM | POA: Diagnosis not present

## 2024-09-29 DIAGNOSIS — M79671 Pain in right foot: Secondary | ICD-10-CM | POA: Diagnosis not present

## 2024-09-29 NOTE — Patient Instructions (Signed)
 Good to see you. Wear the brace. Ok to play bball in November. Ice after activity.  Wear the heel lifts. Continue the patches. See me again in 2 months.

## 2024-09-29 NOTE — Assessment & Plan Note (Signed)
 Significant improvement noted, still has some unfortunate healing to go.  Continue the nitroglycerin , discussed the potential for injection.  We discussed PRP.  Discussed bracing when he increases activity.  Follow-up with me again 2 months to make sure completely improved
